# Patient Record
Sex: Female | Born: 1985 | Race: Black or African American | Hispanic: No | Marital: Single | Smoking: Never smoker
Health system: Southern US, Community
[De-identification: ages and names within clinical notes are randomized; demographics above are authoritative.]

## PROBLEM LIST (undated history)

## (undated) DIAGNOSIS — I10 Essential (primary) hypertension: Secondary | ICD-10-CM

## (undated) DIAGNOSIS — Z9889 Other specified postprocedural states: Secondary | ICD-10-CM

## (undated) DIAGNOSIS — E785 Hyperlipidemia, unspecified: Secondary | ICD-10-CM

## (undated) DIAGNOSIS — K219 Gastro-esophageal reflux disease without esophagitis: Secondary | ICD-10-CM

## (undated) DIAGNOSIS — R112 Nausea with vomiting, unspecified: Secondary | ICD-10-CM

## (undated) HISTORY — PX: CHOLECYSTECTOMY: SHX55

## (undated) HISTORY — DX: Essential (primary) hypertension: I10

## (undated) HISTORY — PX: ABDOMINAL SURGERY: SHX537

## (undated) HISTORY — DX: Nausea with vomiting, unspecified: R11.2

## (undated) HISTORY — DX: Other specified postprocedural states: Z98.890

---

## 2014-01-21 ENCOUNTER — Other Ambulatory Visit: Payer: Self-pay | Admitting: Orthopaedic Surgery

## 2014-01-21 DIAGNOSIS — M545 Low back pain, unspecified: Secondary | ICD-10-CM

## 2014-02-27 ENCOUNTER — Ambulatory Visit (INDEPENDENT_AMBULATORY_CARE_PROVIDER_SITE_OTHER): Payer: BC Managed Care – PPO | Admitting: Surgery

## 2014-03-07 ENCOUNTER — Other Ambulatory Visit: Payer: Self-pay | Admitting: Physician Assistant

## 2014-03-07 ENCOUNTER — Other Ambulatory Visit (HOSPITAL_COMMUNITY)
Admission: RE | Admit: 2014-03-07 | Discharge: 2014-03-07 | Disposition: A | Discharge status: Home / Self Care | Payer: BC Managed Care – PPO | Source: Ambulatory Visit | Attending: Family Medicine | Admitting: Family Medicine

## 2014-03-07 DIAGNOSIS — Z124 Encounter for screening for malignant neoplasm of cervix: Secondary | ICD-10-CM | POA: Insufficient documentation

## 2014-03-07 DIAGNOSIS — R87810 Cervical high risk human papillomavirus (HPV) DNA test positive: Secondary | ICD-10-CM | POA: Insufficient documentation

## 2014-03-07 DIAGNOSIS — Z1151 Encounter for screening for human papillomavirus (HPV): Secondary | ICD-10-CM | POA: Insufficient documentation

## 2014-03-11 LAB — CYTOLOGY - PAP

## 2014-03-19 ENCOUNTER — Other Ambulatory Visit: Payer: Self-pay | Admitting: Obstetrics and Gynecology

## 2014-03-21 ENCOUNTER — Other Ambulatory Visit (INDEPENDENT_AMBULATORY_CARE_PROVIDER_SITE_OTHER): Payer: Self-pay

## 2014-03-21 ENCOUNTER — Ambulatory Visit (INDEPENDENT_AMBULATORY_CARE_PROVIDER_SITE_OTHER): Payer: BC Managed Care – PPO | Admitting: Surgery

## 2014-03-21 ENCOUNTER — Encounter (INDEPENDENT_AMBULATORY_CARE_PROVIDER_SITE_OTHER): Payer: Self-pay | Admitting: Surgery

## 2014-03-21 VITALS — BP 124/74 | HR 76 | Temp 97.7°F | Ht 66.0 in | Wt 330.0 lb

## 2014-03-21 DIAGNOSIS — I10 Essential (primary) hypertension: Secondary | ICD-10-CM

## 2014-03-21 DIAGNOSIS — Z9049 Acquired absence of other specified parts of digestive tract: Secondary | ICD-10-CM | POA: Insufficient documentation

## 2014-03-21 NOTE — Addendum Note (Signed)
Addended by: Brennan BaileyBROOKS, Kaitlinn Iversen on: 03/21/2014 11:13 AM   Modules accepted: Orders

## 2014-03-21 NOTE — Patient Instructions (Signed)

## 2014-03-21 NOTE — Progress Notes (Signed)
Chief Complaint:  Morbid obesity BMI 53  History of Present Illness:  Kimberly Boone is an 28 y.o. 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 She has done a lot of research on the Internet about these and feels that this would best suit her needs. I discussed this operation with her specifically including its complications not limited to bleeding and staple line leakage. She is aware of limitations also.  She has no history of gastroesophageal reflux disease. Denies any history of DVT.  She's tried many diets over the years and sustained weight loss has been unsuccessful. Today's weight is 330.    Past Medical History  Diagnosis Date  . Hypertension     Past Surgical History  Procedure Laterality Date  . Cholecystectomy      Current Outpatient Prescriptions  Medication Sig Dispense Refill  . carvedilol (COREG) 6.25 MG tablet Take 6.25 mg by mouth 2 (two) times daily with a meal.       No current facility-administered medications for this visit.   Review of patient's allergies indicates no known allergies. Family History  Problem Relation Age of Onset  . Cancer Father    Social History:   reports that she has never smoked. She does not have any smokeless tobacco history on file. She reports that she drinks alcohol. She reports that she does not use illicit drugs.   REVIEW OF SYSTEMS : Positive for nothing ; otherwise negative  Physical Exam:   Blood pressure 124/74, pulse 76, temperature 97.7 F (36.5 C), height 5\' 6"  (1.676 m), weight 330 lb (149.687 kg). Body mass index is 53.29 kg/(m^2).  Gen:  WDWN African American female NAD  Neurological: Alert and oriented to person, place, and time. Motor and sensory function is grossly intact  Head: Normocephalic and atraumatic.  Eyes: Conjunctivae are normal. Pupils are equal, round, and reactive to light. No scleral icterus.   Neck: Normal range of motion. Neck supple. No tracheal deviation or thyromegaly present.  Cardiovascular:  SR without murmurs or gallops.  No carotid bruits Breast:  Not examined Respiratory: Effort normal.  No respiratory distress. No chest wall tenderness. Breath sounds normal.  No wheezes, rales or rhonchi.  Abdomen:  Obese nontender GU:  Not examined Musculoskeletal: Normal range of motion. Extremities are nontender. No cyanosis, edema or clubbing noted Lymphadenopathy: No cervical, preauricular, postauricular or axillary adenopathy is present Skin: Skin is warm and dry. No rash noted. No diaphoresis. No erythema. No pallor. Pscyh: Normal mood and affect. Behavior is normal. Judgment and thought content normal.   LABORATORY RESULTS: No results found for this or any previous visit (from the past 48 hour(s)).   RADIOLOGY RESULTS: No results found.  Problem List: There are no active problems to display for this patient.   Assessment & Plan: Morbid obesity and a 28 year old without comorbidities except a BMI of 53 and hypertension. We'll begin workup for sleeve gastrectomy.    Matt B. Daphine DeutscherMartin, MD, [facility]  [facility] [phone] beeper [phone]  03/21/2014 10:42 AM

## 2014-03-21 NOTE — Addendum Note (Signed)
Addended by: Maryan PulsMOORE, Colter Magowan on: 03/21/2014 10:54 AM   Modules accepted: Orders

## 2014-04-05 ENCOUNTER — Other Ambulatory Visit (INDEPENDENT_AMBULATORY_CARE_PROVIDER_SITE_OTHER): Payer: Self-pay | Admitting: Surgery

## 2014-04-06 LAB — CBC WITH DIFFERENTIAL/PLATELET
Basophils Absolute: 0 10*3/uL (ref 0.0–0.1)
Basophils Relative: 0 % (ref 0–1)
EOS ABS: 0.1 10*3/uL (ref 0.0–0.7)
Eosinophils Relative: 1 % (ref 0–5)
HCT: 38.6 % (ref 36.0–46.0)
HEMOGLOBIN: 12.6 g/dL (ref 12.0–15.0)
Lymphocytes Relative: 35 % (ref 12–46)
Lymphs Abs: 2 10*3/uL (ref 0.7–4.0)
MCH: 26.8 pg (ref 26.0–34.0)
MCHC: 32.6 g/dL (ref 30.0–36.0)
MCV: 82.1 fL (ref 78.0–100.0)
MONO ABS: 0.4 10*3/uL (ref 0.1–1.0)
MONOS PCT: 7 % (ref 3–12)
Neutro Abs: 3.2 10*3/uL (ref 1.7–7.7)
Neutrophils Relative %: 57 % (ref 43–77)
Platelets: 213 10*3/uL (ref 150–400)
RBC: 4.7 MIL/uL (ref 3.87–5.11)
RDW: 14.4 % (ref 11.5–15.5)
WBC: 5.7 10*3/uL (ref 4.0–10.5)

## 2014-04-06 LAB — COMPREHENSIVE METABOLIC PANEL
ALBUMIN: 3.6 g/dL (ref 3.5–5.2)
ALK PHOS: 51 U/L (ref 39–117)
ALT: 23 U/L (ref 0–35)
AST: 25 U/L (ref 0–37)
BILIRUBIN TOTAL: 0.6 mg/dL (ref 0.2–1.2)
BUN: 11 mg/dL (ref 6–23)
CO2: 27 mEq/L (ref 19–32)
Calcium: 8.8 mg/dL (ref 8.4–10.5)
Chloride: 103 mEq/L (ref 96–112)
Creat: 0.59 mg/dL (ref 0.50–1.10)
GLUCOSE: 101 mg/dL — AB (ref 70–99)
Potassium: 4.4 mEq/L (ref 3.5–5.3)
Sodium: 138 mEq/L (ref 135–145)
Total Protein: 6.6 g/dL (ref 6.0–8.3)

## 2014-04-06 LAB — VITAMIN D 25 HYDROXY (VIT D DEFICIENCY, FRACTURES): VIT D 25 HYDROXY: 34 ng/mL (ref 30–89)

## 2014-04-06 LAB — VITAMIN B12: Vitamin B-12: 554 pg/mL (ref 211–911)

## 2014-04-06 LAB — IRON AND TIBC
%SAT: 31 % (ref 20–55)
Iron: 97 ug/dL (ref 42–145)
TIBC: 310 ug/dL (ref 250–470)
UIBC: 213 ug/dL (ref 125–400)

## 2014-04-06 LAB — PREGNANCY, URINE: PREG TEST UR: NEGATIVE

## 2014-04-06 LAB — TSH: TSH: 2.906 u[IU]/mL (ref 0.350–4.500)

## 2014-04-06 LAB — T4: T4 TOTAL: 8.6 ug/dL (ref 5.0–12.5)

## 2014-04-09 ENCOUNTER — Ambulatory Visit (HOSPITAL_COMMUNITY)
Admission: RE | Admit: 2014-04-09 | Discharge: 2014-04-09 | Disposition: A | Discharge status: Home / Self Care | Payer: BC Managed Care – PPO | Source: Ambulatory Visit | Attending: Surgery | Admitting: Surgery

## 2014-04-09 ENCOUNTER — Encounter (HOSPITAL_COMMUNITY)
Admission: RE | Disposition: A | Discharge status: Home / Self Care | Payer: Self-pay | Source: Ambulatory Visit | Attending: Surgery

## 2014-04-09 DIAGNOSIS — Z01818 Encounter for other preprocedural examination: Secondary | ICD-10-CM | POA: Insufficient documentation

## 2014-04-09 HISTORY — PX: BREATH TEK H PYLORI: SHX5422

## 2014-04-09 SURGERY — BREATH TEST, FOR HELICOBACTER PYLORI

## 2014-04-09 NOTE — Progress Notes (Signed)
04/09/14 0840  BREATH TEK ASSESSMENT  Referring MD Luretha MurphyMatthew Martin  Time of Last PO Intake 1900  Baseline Breath At: 0712  Pranactin Given At: 0716  Post-Dose Breath At: 0731  Sample 1 4.5  Sample 2 3.0  Test Negative

## 2014-04-10 ENCOUNTER — Encounter (HOSPITAL_COMMUNITY): Payer: Self-pay | Admitting: Surgery

## 2014-04-24 ENCOUNTER — Ambulatory Visit (HOSPITAL_COMMUNITY)
Admission: RE | Admit: 2014-04-24 | Discharge: 2014-04-24 | Disposition: A | Discharge status: Home / Self Care | Payer: BC Managed Care – PPO | Source: Ambulatory Visit | Attending: Surgery | Admitting: Surgery

## 2014-04-24 ENCOUNTER — Other Ambulatory Visit: Payer: Self-pay

## 2014-04-24 DIAGNOSIS — Z9049 Acquired absence of other specified parts of digestive tract: Secondary | ICD-10-CM

## 2014-04-24 DIAGNOSIS — Z6841 Body Mass Index (BMI) 40.0 and over, adult: Secondary | ICD-10-CM | POA: Insufficient documentation

## 2014-04-24 DIAGNOSIS — I10 Essential (primary) hypertension: Secondary | ICD-10-CM

## 2014-04-24 DIAGNOSIS — Z9889 Other specified postprocedural states: Secondary | ICD-10-CM | POA: Insufficient documentation

## 2014-04-27 ENCOUNTER — Encounter: Payer: BC Managed Care – PPO | Attending: Surgery | Admitting: Dietician

## 2014-04-27 DIAGNOSIS — Z6841 Body Mass Index (BMI) 40.0 and over, adult: Secondary | ICD-10-CM | POA: Diagnosis not present

## 2014-04-27 DIAGNOSIS — Z713 Dietary counseling and surveillance: Secondary | ICD-10-CM | POA: Diagnosis present

## 2014-04-27 NOTE — Progress Notes (Signed)
  Pre-Op Assessment Visit:  Pre-Operative Sleeve Gastrectomy Surgery  Medical Nutrition Therapy:  Appt start time: 1115   End time:  1200.  Patient was seen on 04/27/2014 for Pre-Operative Sleeve Gastrectomy Nutrition Assessment. [facility] on 04/27/2014.   Preferred Learning Style:   No preference indicated   Learning Readiness:   Ready  Handouts given during visit include:  Pre-Op Goals Bariatric Surgery Protein Shakes  Teaching Method Utilized:  Visual Auditory Hands on  Barriers to learning/adherence to lifestyle change: none  Demonstrated degree of understanding via:  Teach Back   Patient to call the [facility] to enroll in Pre-Op and Post-Op Nutrition Education when surgery date is scheduled.

## 2014-04-27 NOTE — Patient Instructions (Signed)
Follow Pre-Op Goals Try Protein Shakes Call [facility] at [phone] when surgery is scheduled to enroll in Pre-Op Class 

## 2014-06-17 ENCOUNTER — Encounter: Payer: BC Managed Care – PPO | Attending: Surgery

## 2014-06-17 DIAGNOSIS — Z713 Dietary counseling and surveillance: Secondary | ICD-10-CM | POA: Diagnosis present

## 2014-06-17 DIAGNOSIS — Z6841 Body Mass Index (BMI) 40.0 and over, adult: Secondary | ICD-10-CM | POA: Insufficient documentation

## 2014-06-18 NOTE — Progress Notes (Signed)
  Pre-Operative Nutrition Class:  Appt start time: 1252   End time:  1830.  Patient was seen on 06/17/2014 for Pre-Operative Bariatric Surgery Education at the [facility].   Surgery date: 07/09/2014 Surgery type: Gastric sleeve [facility] weight at [facility]: 332.5 lbs on 04/27/2014 Weight today: 335 lbs  TANITA  BODY COMP RESULTS  06/17/14   BMI (kg/m^2) 54.1   Fat Mass (lbs) 188.5   Fat Free Mass (lbs) 146.5   Total Body Water (lbs) 107.5   Samples given per MNT protocol. Patient educated on appropriate usage: Premier protein shake (chocolate - qty 1) Lot #: [id number] Exp: 05/2015  Unjury protein powder (strawberry - qty 1) Lot #: [id number] Exp: 04/2015  Celebrate Vitamins Multivitamin (orange - qty 1) Lot #: [id number] Exp: 12/2014  PB2 (qty 1) Lot #: [id number] Exp: 04/2015  Bariactiv Multivitamin (qty 1) Lot #: [id number] Exp: 01/2015  The following the learning objectives were met by the patient during this course:  Identify Pre-Op Dietary Goals and will begin 2 weeks pre-operatively  Identify appropriate sources of fluids and proteins   State protein recommendations and appropriate sources pre and post-operatively  Identify Post-Operative Dietary Goals and will follow for 2 weeks post-operatively  Identify appropriate multivitamin and calcium sources  Describe the need for physical activity post-operatively and will follow MD recommendations  State when to call healthcare provider regarding medication questions or post-operative complications  Handouts given during class include:  Pre-Op Bariatric Surgery Diet Handout  Protein Shake Handout  Post-Op Bariatric Surgery Nutrition Handout  BELT Program Information Flyer  Support Group Information Flyer  [facility] Bariatric Supplements Price List  Follow-Up Plan: Patient will follow-up at [facility] 2 weeks post operatively for diet advancement per MD.

## 2014-06-24 ENCOUNTER — Ambulatory Visit: Payer: BC Managed Care – PPO

## 2014-06-27 NOTE — Progress Notes (Signed)
Please put orders in Epic surgery 07-09-14 pre op 07-02-14 Thanks 

## 2014-06-28 ENCOUNTER — Encounter (HOSPITAL_COMMUNITY): Payer: Self-pay | Admitting: Pharmacy Technician

## 2014-07-01 NOTE — Patient Instructions (Addendum)
Kimberly Boone  07/01/2014                           YOUR PROCEDURE IS SCHEDULED ON: 07/09/14                ENTER FROM [address] - GO TO [address]ARKING DECK               LOOK FOR VALET [employer]ARKING  / [employer]                              FOLLOW  SIGNS TO SHORT STAY CENTER                 ARRIVE AT SHORT STAY AT:  8:00 am               CALL THIS NUMBER IF ANY PROBLEMS THE DAY OF SURGERY :               [phone]                                REMEMBER:   Do not eat food or drink liquids AFTER MIDNIGHT                  Take these medicines the morning of surgery with               A SIPS OF WATER :     carvedilol    Do not wear jewelry, make-up   Do not wear lotions, powders, or perfumes.   Do not shave legs or underarms 12 hrs. before surgery (men may shave face)  Do not bring valuables to the hospital.  Contacts, dentures or bridgework may not be worn into surgery.  Leave suitcase in the car. After surgery it may be brought to your room.  For patients admitted to the hospital more than one night, checkout time is            11:00 AM                                                       ________________________________________________________________________                                                                                                  [place] - PREPARING FOR SURGERY  Before surgery, you can play an important role.  Because skin is not sterile, your skin needs to be as free of germs as possible.  You can reduce the number of germs on your skin by washing with CHG (chlorahexidine gluconate) soap before surgery.  CHG is an antiseptic cleaner which kills germs and bonds with the skin to continue killing germs even after washing. Please DO NOT use if you  have an allergy to CHG or antibacterial soaps.  If your skin becomes reddened/irritated stop using the CHG and inform your nurse when you arrive at Short Stay. Do not shave (including legs  and underarms) for at least 48 hours prior to the first CHG shower.  You may shave your face. Please follow these instructions carefully:   1.  Shower with CHG Soap the night before surgery and the  morning of Surgery.   2.  If you choose to wash your hair, wash your hair first as usual with your  normal  Shampoo.   3.  After you shampoo, rinse your hair and body thoroughly to remove the  shampoo.                                         4.  Use CHG as you would any other liquid soap.  You can apply chg directly  to the skin and wash . Gently wash with scrungie or clean wascloth    5.  Apply the CHG Soap to your body ONLY FROM THE NECK DOWN.   Do not use on open                           Wound or open sores. Avoid contact with eyes, ears mouth and genitals (private parts).                        Genitals (private parts) with your normal soap.              6.  Wash thoroughly, paying special attention to the area where your surgery  will be performed.   7.  Thoroughly rinse your body with warm water from the neck down.   8.  DO NOT shower/wash with your normal soap after using and rinsing off  the CHG Soap .                9.  Pat yourself dry with a clean towel.             10.  Wear clean pajamas.             11.  Place clean sheets on your bed the night of your first shower and do not  sleep with pets.  Day of Surgery : Do not apply any lotions/deodorants the morning of surgery.  Please wear clean clothes to the hospital/surgery center.  FAILURE TO FOLLOW THESE INSTRUCTIONS MAY RESULT IN THE CANCELLATION OF YOUR SURGERY    PATIENT SIGNATURE_________________________________  ______________________________________________________________________

## 2014-07-01 NOTE — Progress Notes (Signed)
NEED ORDERS IN EPIC PLEASE - pt coming for preop tomorrow - 07/02/14 - thank you

## 2014-07-02 ENCOUNTER — Encounter (HOSPITAL_COMMUNITY)
Admission: RE | Admit: 2014-07-02 | Discharge: 2014-07-02 | Disposition: A | Discharge status: Home / Self Care | Payer: BC Managed Care – PPO | Source: Ambulatory Visit | Attending: Surgery | Admitting: Surgery

## 2014-07-02 ENCOUNTER — Encounter (HOSPITAL_COMMUNITY): Payer: Self-pay

## 2014-07-02 DIAGNOSIS — Z6841 Body Mass Index (BMI) 40.0 and over, adult: Secondary | ICD-10-CM | POA: Diagnosis not present

## 2014-07-02 DIAGNOSIS — Z0000 Encounter for general adult medical examination without abnormal findings: Secondary | ICD-10-CM | POA: Diagnosis not present

## 2014-07-02 DIAGNOSIS — I10 Essential (primary) hypertension: Secondary | ICD-10-CM | POA: Diagnosis not present

## 2014-07-02 LAB — COMPREHENSIVE METABOLIC PANEL
ALBUMIN: 3.7 g/dL (ref 3.5–5.2)
ALK PHOS: 63 U/L (ref 39–117)
ALT: 27 U/L (ref 0–35)
ANION GAP: 14 (ref 5–15)
AST: 21 U/L (ref 0–37)
BILIRUBIN TOTAL: 0.5 mg/dL (ref 0.3–1.2)
BUN: 16 mg/dL (ref 6–23)
CHLORIDE: 102 meq/L (ref 96–112)
CO2: 23 mEq/L (ref 19–32)
Calcium: 9.6 mg/dL (ref 8.4–10.5)
Creatinine, Ser: 0.57 mg/dL (ref 0.50–1.10)
GFR calc Af Amer: >90 mL/min (ref >90)
GFR calc non Af Amer: >90 mL/min (ref >90)
GLUCOSE: 86 mg/dL (ref 70–99)
POTASSIUM: 4.9 meq/L (ref 3.7–5.3)
Sodium: 139 mEq/L (ref 137–147)
Total Protein: 8 g/dL (ref 6.0–8.3)

## 2014-07-02 LAB — CBC
HCT: 37.3 % (ref 36.0–46.0)
HEMOGLOBIN: 12.7 g/dL (ref 12.0–15.0)
MCH: 27.5 pg (ref 26.0–34.0)
MCHC: 34 g/dL (ref 30.0–36.0)
MCV: 80.7 fL (ref 78.0–100.0)
Platelets: 210 10*3/uL (ref 150–400)
RBC: 4.62 MIL/uL (ref 3.87–5.11)
RDW: 13.4 % (ref 11.5–15.5)
WBC: 7.1 10*3/uL (ref 4.0–10.5)

## 2014-07-02 LAB — HCG, SERUM, QUALITATIVE: Preg, Serum: NEGATIVE

## 2014-07-08 ENCOUNTER — Ambulatory Visit (INDEPENDENT_AMBULATORY_CARE_PROVIDER_SITE_OTHER): Payer: Self-pay | Admitting: Surgery

## 2014-07-08 NOTE — H&P (Signed)
Chief Complaint: Morbid obesity BMI 53  History of Present Illness: Kimberly Boone is an 28 y.o. 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 She has done a lot of research on the Internet about these and feels that this would best suit her needs. I discussed this operation with her specifically including its complications not limited to bleeding and staple line leakage. She is aware of limitations also. She has no history of gastroesophageal reflux disease. Denies any history of DVT.  She's tried many diets over the years and sustained weight loss has been unsuccessful. Today's weight is 330.  Past Medical History   Diagnosis  Date   .  Hypertension     Past Surgical History   Procedure  Laterality  Date   .  Cholecystectomy      Current Outpatient Prescriptions   Medication  Sig  Dispense  Refill   .  carvedilol (COREG) 6.25 MG tablet  Take 6.25 mg by mouth 2 (two) times daily with a meal.      No current facility-administered medications for this visit.   Review of patient's allergies indicates no known allergies.  Family History   Problem  Relation  Age of Onset   .  Cancer  Father    Social History: reports that she has never smoked. She does not have any smokeless tobacco history on file. She reports that she drinks alcohol. She reports that she does not use illicit drugs.  REVIEW OF SYSTEMS :  Positive for nothing ; otherwise negative  Physical Exam:  Blood pressure 124/74, pulse 76, temperature 97.7 F (36.5 C), height 5\' 6"  (1.676 m), weight 330 lb (149.687 kg).  Body mass index is 53.29 kg/(m^2).  Gen: WDWN African American female NAD  Neurological: Alert and oriented to person, place, and time. Motor and sensory function is grossly intact  Head: Normocephalic and atraumatic.  Eyes: Conjunctivae are normal. Pupils are equal, round, and reactive to light. No scleral  icterus.  Neck: Normal range of motion. Neck supple. No tracheal deviation or thyromegaly present.  Cardiovascular: SR without murmurs or gallops. No carotid bruits  Breast: Not examined  Respiratory: Effort normal. No respiratory distress. No chest wall tenderness. Breath sounds normal. No wheezes, rales or rhonchi.  Abdomen: Obese nontender  GU: Not examined  Musculoskeletal: Normal range of motion. Extremities are nontender. No cyanosis, edema or clubbing noted Lymphadenopathy: No cervical, preauricular, postauricular or axillary adenopathy is present Skin: Skin is warm and dry. No rash noted. No diaphoresis. No erythema. No pallor. Pscyh: Normal mood and affect. Behavior is normal. Judgment and thought content normal.  LABORATORY RESULTS:  No results found for this or any previous visit (from the past 48 hour(s)).  RADIOLOGY RESULTS:  No results found.  Problem List:  There are no active problems to display for this patient.  Assessment & Plan:  Morbid obesity and a 28 year old without comorbidities except a BMI of 53 and hypertension. We'll begin workup for sleeve gastrectomy.  Matt B. Daphine DeutscherMartin, MD, [facility]ACS  [facility]  [phone] beeper  [phone]

## 2014-07-09 ENCOUNTER — Inpatient Hospital Stay (HOSPITAL_COMMUNITY): Payer: BC Managed Care – PPO | Admitting: Anesthesiology

## 2014-07-09 ENCOUNTER — Encounter (HOSPITAL_COMMUNITY)
Admission: RE | Disposition: A | Discharge status: Home / Self Care | Payer: Self-pay | Source: Ambulatory Visit | Attending: Surgery

## 2014-07-09 ENCOUNTER — Encounter (HOSPITAL_COMMUNITY): Payer: Self-pay | Admitting: *Deleted

## 2014-07-09 ENCOUNTER — Inpatient Hospital Stay (HOSPITAL_COMMUNITY)
Admission: RE | Admit: 2014-07-09 | Discharge: 2014-07-11 | DRG: 621 | Disposition: A | Discharge status: Home / Self Care | Payer: BC Managed Care – PPO | Source: Ambulatory Visit | Attending: Surgery | Admitting: Surgery

## 2014-07-09 ENCOUNTER — Encounter (HOSPITAL_COMMUNITY): Payer: BC Managed Care – PPO | Admitting: Anesthesiology

## 2014-07-09 DIAGNOSIS — Z9049 Acquired absence of other specified parts of digestive tract: Secondary | ICD-10-CM | POA: Diagnosis present

## 2014-07-09 DIAGNOSIS — Z01812 Encounter for preprocedural laboratory examination: Secondary | ICD-10-CM | POA: Diagnosis not present

## 2014-07-09 DIAGNOSIS — Z9884 Bariatric surgery status: Secondary | ICD-10-CM

## 2014-07-09 DIAGNOSIS — Z79899 Other long term (current) drug therapy: Secondary | ICD-10-CM

## 2014-07-09 DIAGNOSIS — Z809 Family history of malignant neoplasm, unspecified: Secondary | ICD-10-CM | POA: Diagnosis not present

## 2014-07-09 DIAGNOSIS — Z6841 Body Mass Index (BMI) 40.0 and over, adult: Secondary | ICD-10-CM | POA: Diagnosis not present

## 2014-07-09 DIAGNOSIS — I10 Essential (primary) hypertension: Secondary | ICD-10-CM | POA: Diagnosis present

## 2014-07-09 HISTORY — PX: LAPAROSCOPIC GASTRIC SLEEVE RESECTION: SHX5895

## 2014-07-09 LAB — HEMOGLOBIN AND HEMATOCRIT, BLOOD
HCT: 36 % (ref 36.0–46.0)
Hemoglobin: 11.8 g/dL — ABNORMAL LOW (ref 12.0–15.0)

## 2014-07-09 LAB — CBC
HCT: 37.4 % (ref 36.0–46.0)
Hemoglobin: 12.7 g/dL (ref 12.0–15.0)
MCH: 27.6 pg (ref 26.0–34.0)
MCHC: 34 g/dL (ref 30.0–36.0)
MCV: 81.3 fL (ref 78.0–100.0)
PLATELETS: 217 10*3/uL (ref 150–400)
RBC: 4.6 MIL/uL (ref 3.87–5.11)
RDW: 13.1 % (ref 11.5–15.5)
WBC: 13.6 10*3/uL — ABNORMAL HIGH (ref 4.0–10.5)

## 2014-07-09 LAB — CREATININE, SERUM
CREATININE: 0.69 mg/dL (ref 0.50–1.10)
GFR calc Af Amer: >90 mL/min (ref >90)
GFR calc non Af Amer: >90 mL/min (ref >90)

## 2014-07-09 SURGERY — GASTRECTOMY, SLEEVE, LAPAROSCOPIC
Anesthesia: General | Site: Abdomen

## 2014-07-09 MED ORDER — HEPARIN SODIUM (PORCINE) 5000 UNIT/ML IJ SOLN
5000.0000 [IU] | INTRAMUSCULAR | Status: AC
  Administered 2014-07-09: 5000 [IU] via SUBCUTANEOUS
  Filled 2014-07-09: qty 1

## 2014-07-09 MED ORDER — LACTATED RINGERS IR SOLN
Status: DC | PRN
Start: 2014-07-09 — End: 2014-07-09
  Administered 2014-07-09: 1000 mL

## 2014-07-09 MED ORDER — HYDROMORPHONE HCL 1 MG/ML IJ SOLN
INTRAMUSCULAR | Status: AC
  Filled 2014-07-09: qty 1

## 2014-07-09 MED ORDER — PROPOFOL 10 MG/ML IV BOLUS
INTRAVENOUS | Status: DC | PRN
  Administered 2014-07-09: 50 mg via INTRAVENOUS
  Administered 2014-07-09: 170 mg via INTRAVENOUS

## 2014-07-09 MED ORDER — SUFENTANIL CITRATE 50 MCG/ML IV SOLN
INTRAVENOUS | Status: AC
  Filled 2014-07-09: qty 1

## 2014-07-09 MED ORDER — DEXTROSE 5 % IV SOLN
INTRAVENOUS | Status: AC
  Filled 2014-07-09: qty 2

## 2014-07-09 MED ORDER — CHLORHEXIDINE GLUCONATE CLOTH 2 % EX PADS: 6.0000 | MEDICATED_PAD | Freq: Once | CUTANEOUS | Status: DC

## 2014-07-09 MED ORDER — ACETAMINOPHEN 160 MG/5ML PO SOLN: 325.0000 mg | ORAL | Status: DC | PRN

## 2014-07-09 MED ORDER — UNJURY CHOCOLATE CLASSIC POWDER: 2.0000 [oz_av] | Freq: Four times a day (QID) | ORAL | Status: DC

## 2014-07-09 MED ORDER — PHENYLEPHRINE 40 MCG/ML (10ML) SYRINGE FOR IV PUSH (FOR BLOOD PRESSURE SUPPORT)
PREFILLED_SYRINGE | INTRAVENOUS | Status: AC
  Filled 2014-07-09: qty 10

## 2014-07-09 MED ORDER — ACETAMINOPHEN 160 MG/5ML PO SOLN: 650.0000 mg | ORAL | Status: DC | PRN

## 2014-07-09 MED ORDER — FENTANYL CITRATE 0.05 MG/ML IJ SOLN
INTRAMUSCULAR | Status: DC | PRN
  Administered 2014-07-09 (×2): 50 ug via INTRAVENOUS
  Administered 2014-07-09: 100 ug via INTRAVENOUS
  Administered 2014-07-09 (×2): 50 ug via INTRAVENOUS

## 2014-07-09 MED ORDER — 0.9 % SODIUM CHLORIDE (POUR BTL) OPTIME
TOPICAL | Status: DC | PRN
  Administered 2014-07-09: 1000 mL

## 2014-07-09 MED ORDER — SUCCINYLCHOLINE CHLORIDE 20 MG/ML IJ SOLN
INTRAMUSCULAR | Status: DC | PRN
  Administered 2014-07-09: 140 mg via INTRAVENOUS

## 2014-07-09 MED ORDER — METOCLOPRAMIDE HCL 5 MG/ML IJ SOLN
INTRAMUSCULAR | Status: AC
  Filled 2014-07-09: qty 2

## 2014-07-09 MED ORDER — LIDOCAINE HCL (CARDIAC) 20 MG/ML IV SOLN
INTRAVENOUS | Status: DC | PRN
  Administered 2014-07-09: 25 mg via INTRAVENOUS
  Administered 2014-07-09: 75 mg via INTRAVENOUS

## 2014-07-09 MED ORDER — FENTANYL CITRATE 0.05 MG/ML IJ SOLN
25.0000 ug | INTRAMUSCULAR | Status: DC | PRN
  Administered 2014-07-09 (×3): 50 ug via INTRAVENOUS

## 2014-07-09 MED ORDER — UNJURY CHICKEN SOUP POWDER
2.0000 [oz_av] | Freq: Four times a day (QID) | ORAL | Status: DC
  Administered 2014-07-11: 8 [oz_av] via ORAL
  Administered 2014-07-11: 2 [oz_av] via ORAL

## 2014-07-09 MED ORDER — FENTANYL CITRATE 0.05 MG/ML IJ SOLN
INTRAMUSCULAR | Status: AC
  Filled 2014-07-09: qty 2

## 2014-07-09 MED ORDER — BUPIVACAINE LIPOSOME 1.3 % IJ SUSP
20.0000 mL | Freq: Once | INTRAMUSCULAR | Status: AC
  Administered 2014-07-09: 20 mL
  Filled 2014-07-09: qty 20

## 2014-07-09 MED ORDER — MIDAZOLAM HCL 2 MG/2ML IJ SOLN
INTRAMUSCULAR | Status: AC
  Filled 2014-07-09: qty 2

## 2014-07-09 MED ORDER — ACETAMINOPHEN 10 MG/ML IV SOLN
1000.0000 mg | Freq: Once | INTRAVENOUS | Status: DC
  Filled 2014-07-09: qty 100

## 2014-07-09 MED ORDER — PROMETHAZINE HCL 25 MG/ML IJ SOLN
12.5000 mg | INTRAMUSCULAR | Status: DC | PRN
  Administered 2014-07-10 (×2): 12.5 mg via INTRAVENOUS
  Filled 2014-07-09 (×2): qty 1

## 2014-07-09 MED ORDER — DEXAMETHASONE SODIUM PHOSPHATE 10 MG/ML IJ SOLN
INTRAMUSCULAR | Status: DC | PRN
  Administered 2014-07-09: 10 mg via INTRAVENOUS

## 2014-07-09 MED ORDER — PROPOFOL 10 MG/ML IV BOLUS
INTRAVENOUS | Status: AC
  Filled 2014-07-09: qty 20

## 2014-07-09 MED ORDER — ONDANSETRON HCL 4 MG/2ML IJ SOLN
INTRAMUSCULAR | Status: AC
  Filled 2014-07-09: qty 2

## 2014-07-09 MED ORDER — METOCLOPRAMIDE HCL 5 MG/ML IJ SOLN
INTRAMUSCULAR | Status: DC | PRN
  Administered 2014-07-09 (×2): 10 mg via INTRAVENOUS

## 2014-07-09 MED ORDER — GLYCOPYRROLATE 0.2 MG/ML IJ SOLN
INTRAMUSCULAR | Status: AC
  Filled 2014-07-09: qty 1

## 2014-07-09 MED ORDER — PHENYLEPHRINE HCL 10 MG/ML IJ SOLN
INTRAMUSCULAR | Status: DC | PRN
  Administered 2014-07-09: 80 ug via INTRAVENOUS
  Administered 2014-07-09: 40 ug via INTRAVENOUS
  Administered 2014-07-09: 80 ug via INTRAVENOUS
  Administered 2014-07-09: 40 ug via INTRAVENOUS
  Administered 2014-07-09: 80 ug via INTRAVENOUS

## 2014-07-09 MED ORDER — LACTATED RINGERS IV SOLN
INTRAVENOUS | Status: DC
  Administered 2014-07-09: 12:00:00 via INTRAVENOUS
  Administered 2014-07-09: 1000 mL via INTRAVENOUS

## 2014-07-09 MED ORDER — ONDANSETRON HCL 4 MG/2ML IJ SOLN: 4.0000 mg | Freq: Once | INTRAMUSCULAR | Status: DC | PRN

## 2014-07-09 MED ORDER — GLYCOPYRROLATE 0.2 MG/ML IJ SOLN
INTRAMUSCULAR | Status: AC
  Filled 2014-07-09: qty 3

## 2014-07-09 MED ORDER — CISATRACURIUM BESYLATE (PF) 10 MG/5ML IV SOLN
INTRAVENOUS | Status: DC | PRN
  Administered 2014-07-09: 2 mg via INTRAVENOUS
  Administered 2014-07-09: 12 mg via INTRAVENOUS
  Administered 2014-07-09 (×2): 2 mg via INTRAVENOUS

## 2014-07-09 MED ORDER — GLYCOPYRROLATE 0.2 MG/ML IJ SOLN
INTRAMUSCULAR | Status: DC | PRN
  Administered 2014-07-09: 0.6 mg via INTRAVENOUS
  Administered 2014-07-09: 0.1 mg via INTRAVENOUS

## 2014-07-09 MED ORDER — MORPHINE SULFATE 2 MG/ML IJ SOLN
2.0000 mg | INTRAMUSCULAR | Status: DC | PRN
  Administered 2014-07-09: 4 mg via INTRAVENOUS
  Administered 2014-07-09 – 2014-07-10 (×4): 2 mg via INTRAVENOUS
  Filled 2014-07-09: qty 1
  Filled 2014-07-09: qty 2
  Filled 2014-07-09 (×5): qty 1

## 2014-07-09 MED ORDER — CEFOXITIN SODIUM 2 G IV SOLR
2.0000 g | INTRAVENOUS | Status: AC
  Administered 2014-07-09: 2 g via INTRAVENOUS

## 2014-07-09 MED ORDER — DEXAMETHASONE SODIUM PHOSPHATE 10 MG/ML IJ SOLN
INTRAMUSCULAR | Status: AC
  Filled 2014-07-09: qty 1

## 2014-07-09 MED ORDER — ONDANSETRON HCL 4 MG/2ML IJ SOLN
4.0000 mg | INTRAMUSCULAR | Status: DC | PRN
  Administered 2014-07-09 – 2014-07-10 (×3): 4 mg via INTRAVENOUS
  Filled 2014-07-09 (×4): qty 2

## 2014-07-09 MED ORDER — MIDAZOLAM HCL 5 MG/5ML IJ SOLN
INTRAMUSCULAR | Status: DC | PRN
  Administered 2014-07-09: 2 mg via INTRAVENOUS

## 2014-07-09 MED ORDER — KCL IN DEXTROSE-NACL 20-5-0.45 MEQ/L-%-% IV SOLN
INTRAVENOUS | Status: DC
Start: 2014-07-09 — End: 2014-07-11
  Administered 2014-07-09 – 2014-07-10 (×2): via INTRAVENOUS
  Administered 2014-07-10: 100 mL/h via INTRAVENOUS
  Administered 2014-07-10: 23:00:00 via INTRAVENOUS
  Filled 2014-07-09 (×6): qty 1000

## 2014-07-09 MED ORDER — NEOSTIGMINE METHYLSULFATE 10 MG/10ML IV SOLN
INTRAVENOUS | Status: AC
  Filled 2014-07-09: qty 1

## 2014-07-09 MED ORDER — FENTANYL CITRATE 0.05 MG/ML IJ SOLN
INTRAMUSCULAR | Status: AC
  Filled 2014-07-09: qty 5

## 2014-07-09 MED ORDER — NEOSTIGMINE METHYLSULFATE 10 MG/10ML IV SOLN
INTRAVENOUS | Status: DC | PRN
  Administered 2014-07-09: 5 mg via INTRAVENOUS

## 2014-07-09 MED ORDER — LIDOCAINE HCL (CARDIAC) 20 MG/ML IV SOLN
INTRAVENOUS | Status: AC
  Filled 2014-07-09: qty 5

## 2014-07-09 MED ORDER — UNJURY VANILLA POWDER: 2.0000 [oz_av] | Freq: Four times a day (QID) | ORAL | Status: DC

## 2014-07-09 MED ORDER — HEPARIN SODIUM (PORCINE) 5000 UNIT/ML IJ SOLN
5000.0000 [IU] | Freq: Three times a day (TID) | INTRAMUSCULAR | Status: DC
  Administered 2014-07-09 – 2014-07-11 (×5): 5000 [IU] via SUBCUTANEOUS
  Filled 2014-07-09 (×8): qty 1

## 2014-07-09 MED ORDER — OXYCODONE HCL 5 MG/5ML PO SOLN
5.0000 mg | ORAL | Status: DC | PRN
  Administered 2014-07-10: 5 mg via ORAL
  Administered 2014-07-10: 10 mg via ORAL
  Administered 2014-07-10: 5 mg via ORAL
  Filled 2014-07-09: qty 10
  Filled 2014-07-09 (×2): qty 5
  Filled 2014-07-09: qty 10

## 2014-07-09 MED ORDER — CISATRACURIUM BESYLATE 20 MG/10ML IV SOLN
INTRAVENOUS | Status: AC
  Filled 2014-07-09: qty 10

## 2014-07-09 MED ORDER — ONDANSETRON HCL 4 MG/2ML IJ SOLN
INTRAMUSCULAR | Status: DC | PRN
  Administered 2014-07-09 (×2): 4 mg via INTRAVENOUS

## 2014-07-09 MED ORDER — ACETAMINOPHEN 10 MG/ML IV SOLN
INTRAVENOUS | Status: DC | PRN
  Administered 2014-07-09: 1000 mg via INTRAVENOUS

## 2014-07-09 MED ORDER — HYDROMORPHONE HCL 1 MG/ML IJ SOLN
0.2500 mg | INTRAMUSCULAR | Status: DC | PRN
  Administered 2014-07-09 (×4): 0.5 mg via INTRAVENOUS

## 2014-07-09 SURGICAL SUPPLY — 61 items
APPLICATOR COTTON TIP 6IN STRL (MISCELLANEOUS) ×3 IMPLANT
APPLIER CLIP ROT 10 11.4 M/L (STAPLE)
APPLIER CLIP ROT 13.4 12 LRG (CLIP)
BLADE HEX COATED 2.75 (ELECTRODE) IMPLANT
BLADE SURG 15 STRL LF DISP TIS (BLADE) ×1 IMPLANT
BLADE SURG 15 STRL SS (BLADE) ×2
CABLE HIGH FREQUENCY MONO STRZ (ELECTRODE) IMPLANT
CLIP APPLIE ROT 10 11.4 M/L (STAPLE) IMPLANT
CLIP APPLIE ROT 13.4 12 LRG (CLIP) IMPLANT
DERMABOND ADVANCED (GAUZE/BANDAGES/DRESSINGS) ×2
DERMABOND ADVANCED .7 DNX12 (GAUZE/BANDAGES/DRESSINGS) ×1 IMPLANT
DEVICE SUT QUICK LOAD TK 5 (STAPLE) IMPLANT
DEVICE SUT TI-KNOT TK 5X26 (MISCELLANEOUS) IMPLANT
DEVICE SUTURE ENDOST 10MM (ENDOMECHANICALS) IMPLANT
DEVICE TI KNOT TK5 (MISCELLANEOUS)
DEVICE TROCAR PUNCTURE CLOSURE (ENDOMECHANICALS) ×3 IMPLANT
DISSECTOR BLUNT TIP ENDO 5MM (MISCELLANEOUS) ×3 IMPLANT
DRAPE CAMERA CLOSED 9X96 (DRAPES) ×3 IMPLANT
DRAPE UNIVERSAL PACK (DRAPES) ×3 IMPLANT
ELECT REM PT RETURN 9FT ADLT (ELECTROSURGICAL) ×3
ELECTRODE REM PT RTRN 9FT ADLT (ELECTROSURGICAL) ×1 IMPLANT
GAUZE SPONGE 4X4 12PLY STRL (GAUZE/BANDAGES/DRESSINGS) IMPLANT
GLOVE BIOGEL M 8.0 STRL (GLOVE) ×3 IMPLANT
GOWN STRL REUS W/TWL XL LVL3 (GOWN DISPOSABLE) ×9 IMPLANT
HANDLE STAPLE EGIA 4 XL (STAPLE) ×3 IMPLANT
HOVERMATT SINGLE USE (MISCELLANEOUS) ×3 IMPLANT
KIT BASIN OR (CUSTOM PROCEDURE TRAY) ×3 IMPLANT
NEEDLE SPNL 22GX3.5 QUINCKE BK (NEEDLE) ×3 IMPLANT
PEN SKIN MARKING BROAD (MISCELLANEOUS) ×3 IMPLANT
QUICK LOAD TK 5 (STAPLE)
RELOAD ENDO STITCH (ENDOMECHANICALS) IMPLANT
RELOAD TRI 45 ART MED THCK BLK (STAPLE) ×3 IMPLANT
RELOAD TRI 45 ART MED THCK PUR (STAPLE) IMPLANT
RELOAD TRI 60 ART MED THCK BLK (STAPLE) ×6 IMPLANT
RELOAD TRI 60 ART MED THCK PUR (STAPLE) ×6 IMPLANT
SCISSORS LAP 5X45 EPIX DISP (ENDOMECHANICALS) IMPLANT
SCRUB PCMX 4 OZ (MISCELLANEOUS) ×6 IMPLANT
SEALANT SURGICAL APPL DUAL CAN (MISCELLANEOUS) IMPLANT
SET IRRIG TUBING LAPAROSCOPIC (IRRIGATION / IRRIGATOR) ×3 IMPLANT
SHEARS CURVED HARMONIC AC 45CM (MISCELLANEOUS) ×3 IMPLANT
SLEEVE ADV FIXATION 5X100MM (TROCAR) ×6 IMPLANT
SLEEVE GASTRECTOMY 36FR VISIGI (MISCELLANEOUS) ×3 IMPLANT
SOLUTION ANTI FOG 6CC (MISCELLANEOUS) ×3 IMPLANT
SPONGE LAP 18X18 X RAY DECT (DISPOSABLE) ×3 IMPLANT
STAPLER VISISTAT 35W (STAPLE) ×3 IMPLANT
SUT VIC AB 4-0 SH 18 (SUTURE) ×3 IMPLANT
SYR 20CC LL (SYRINGE) ×3 IMPLANT
SYR 50ML LL SCALE MARK (SYRINGE) ×3 IMPLANT
TOWEL OR 17X26 10 PK STRL BLUE (TOWEL DISPOSABLE) ×6 IMPLANT
TOWEL OR NON WOVEN STRL DISP B (DISPOSABLE) ×3 IMPLANT
TRAY FOLEY CATH 14FRSI W/METER (CATHETERS) IMPLANT
TROCAR ADV FIXATION 12X100MM (TROCAR) ×3 IMPLANT
TROCAR ADV FIXATION 5X100MM (TROCAR) ×3 IMPLANT
TROCAR BLADELESS 15MM (ENDOMECHANICALS) ×3 IMPLANT
TROCAR BLADELESS OPT 5 100 (ENDOMECHANICALS) ×3 IMPLANT
TROCAR BLADELESS OPT 5 150 (ENDOMECHANICALS) ×9 IMPLANT
TUBE CALIBRATION LAPBAND (TUBING) IMPLANT
TUBING CONNECTING 10 (TUBING) ×2 IMPLANT
TUBING CONNECTING 10' (TUBING) ×1
TUBING ENDO SMARTCAP (MISCELLANEOUS) ×3 IMPLANT
TUBING FILTER THERMOFLATOR (ELECTROSURGICAL) ×3 IMPLANT

## 2014-07-09 NOTE — Anesthesia Preprocedure Evaluation (Addendum)
Anesthesia Evaluation  Patient identified by MRN, date of birth, ID band Patient awake    Reviewed: Allergy & Precautions, H&P , NPO status , Patient's Chart, lab work & pertinent test results, reviewed documented beta blocker date and time   History of Anesthesia Complications Negative for: history of anesthetic complications  Airway Mallampati: I TM Distance: >3 FB Neck ROM: Full   Comment: MP 1, large mouth opening TMD >6cm Dental no notable dental hx. (+) Dental Advisory Given,    Pulmonary neg pulmonary ROS,  breath sounds clear to auscultation  Pulmonary exam normal       Cardiovascular hypertension, Pt. on medications and Pt. on home beta blockers Rhythm:Regular Rate:Normal  Took coreg this am   Neuro/Psych negative neurological ROS  negative psych ROS   GI/Hepatic negative GI ROS, Neg liver ROS,   Endo/Other  Morbid obesity  Renal/GU negative Renal ROS  negative genitourinary   Musculoskeletal negative musculoskeletal ROS (+)   Abdominal (+) + obese,   Peds negative pediatric ROS (+)  Hematology negative hematology ROS (+)   Anesthesia Other Findings   Reproductive/Obstetrics negative OB ROS                        Anesthesia Physical Anesthesia Plan  ASA: III  Anesthesia Plan: General   Post-op Pain Management:    Induction: Intravenous  Airway Management Planned: Oral ETT  Additional Equipment:   Intra-op Plan:   Post-operative Plan: Extubation in OR  Informed Consent: I have reviewed the patients History and Physical, chart, labs and discussed the procedure including the risks, benefits and alternatives for the proposed anesthesia with the patient or authorized representative who has indicated his/her understanding and acceptance.   Dental advisory given  Plan Discussed with: CRNA  Anesthesia Plan Comments:         Anesthesia Quick Evaluation

## 2014-07-09 NOTE — Anesthesia Postprocedure Evaluation (Signed)
  Anesthesia Post-op Note  Patient: Kimberly Boone  Procedure(s) Performed: Procedure(s) (LRB): LAPAROSCOPIC GASTRIC SLEEVE RESECTION WITH UPPER ENDOSCOPY (N/A)  Patient Location: PACU  Anesthesia Type: General  Level of Consciousness: awake and alert   Airway and Oxygen Therapy: Patient Spontanous Breathing  Post-op Pain: mild  Post-op Assessment: Post-op Vital signs reviewed, Patient's Cardiovascular Status Stable, Respiratory Function Stable, Patent Airway and No signs of Nausea or vomiting  Last Vitals:  Filed Vitals:   07/09/14 0742  BP: 133/77  Pulse: 74  Temp: 36.7 C  Resp: 18    Post-op Vital Signs: stable   Complications: No apparent anesthesia complications

## 2014-07-09 NOTE — Op Note (Signed)
Kimberly Boone [id number] 04/11/1986 07/09/2014  Preoperative diagnosis: morbid obesity  Postoperative diagnosis: Same   Procedure: Esophagogastroduodenoscopy   Surgeon: Mary SellaEric M. Jairy Angulo M.D., FACS FASMBS  Anesthesia: Gen.   Indications for procedure: [age]year old female undergoing Laparoscopic Gastric Sleeve Resection and an EGD was requested to evaluate the new gastric sleeve.   Description of procedure: After we have completed the sleeve resection, I scrubbed out and obtained the Olympus endoscope. I gently placed endoscope in the patient's oropharynx and gently glided it down the esophagus without any difficulty under direct visualization. Once I was in the gastric sleeve, I insufflated the stomach with air. I was able to cannulate and advanced the scope through the gastric sleeve. I was able to cannulate the duodenum with ease. Dr. Daphine DeutscherMartin had placed saline in the upper abdomen. Upon further insufflation of the gastric sleeve there was no evidence of bubbles. GE junction located at 40 cm.  Upon further inspection of the gastric sleeve, the mucosa appeared normal. There is no evidence of any mucosal abnormality. The sleeve was widely patent at the angularis. There was no evidence of bleeding. The gastric sleeve was decompressed. The scope was withdrawn. The patient tolerated this portion of the procedure well. Please see Dr Ermalene SearingMartin's operative note for details regarding the laparoscopic gastric sleeve resection.   Mary SellaEric M. Andrey CampanileWilson, MD, FACS  General, Bariatric, & Minimally Invasive Surgery  [facility] [place]

## 2014-07-09 NOTE — Transfer of Care (Signed)
Immediate Anesthesia Transfer of Care Note  Patient: Kimberly Boone  Procedure(s) Performed: Procedure(s): LAPAROSCOPIC GASTRIC SLEEVE RESECTION WITH UPPER ENDOSCOPY (N/A)  Patient Location: PACU  Anesthesia Type:General  Level of Consciousness: Patient easily awoken, sedated, comfortable, cooperative, following commands, responds to stimulation.   Airway & Oxygen Therapy: Patient spontaneously breathing, ventilating well, oxygen via simple oxygen mask.  Post-op Assessment: Report given to PACU RN, vital signs reviewed and stable, moving all extremities.   Post vital signs: Reviewed and stable.  Complications: No apparent anesthesia complications

## 2014-07-09 NOTE — Interval H&P Note (Signed)
History and Physical Interval Note:  07/09/2014 10:35 AM  Kimberly Boone  has presented today for surgery, with the diagnosis of Morbid Obesity  The various methods of treatment have been discussed with the patient and family. After consideration of risks, benefits and other options for treatment, the patient has consented to  Procedure(s): LAPAROSCOPIC GASTRIC SLEEVE RESECTION (N/A) as a surgical intervention .  The patient's history has been reviewed, patient examined, no change in status, stable for surgery.  I have reviewed the patient's chart and labs.  Questions were answered to the patient's satisfaction.     [name]

## 2014-07-09 NOTE — Op Note (Signed)
Surgeon: Wenda LowMatt Kaison Mcparland, MD, FACS  Asst:  Gaynelle AduEric Wilson, MD, FACS  Anes:  General endotracheal  Procedure: Laparoscopic sleeve gastrectomy and upper endoscopy (Dr. Andrey CampanileWilson)  Diagnosis: Morbid obesity-BMI 53  Complications: none  EBL:   30 cc  Description of Procedure:  The patient was take to OR 1 and given general anesthesia.  The abdomen was prepped with PCMX and draped sterilely.  A timeout was performed.  Access to the abdomen was achieved with a long 5 mm Optiview technique through the left upper quadrant.  Following insufflation, the state of the abdomen was found to be free of adhesions but the thickness of her abdominal wall made trocar movement limited.  The ViSiGi 36Fr tube was inserted to deflate the stomach and was pulled back into the esophagus.    The pylorus was identified and we measured 5 cm back and marked the antrum.  At that point we began dissection to take down the greater curvature of the stomach using the Enseal.  This dissection was taken all the way up to the left crus.  Posterior attachments of the stomach were also taken down.    The ViSiGi tube was then passed into the antrum and suction applied so that it was snug along the lessor curvature.  The "crow's foot" or incisura was identified.  The sleeve gastrectomy was begun using the [employer] platform stapler beginning with a 4.5 mm black cartrige with TRS and continue with two other black loads and then purple loads all with TRS to complete the sleeve.  .  When the sleeve was complete the tube was taken off suction and insufflated briefly.  The tube was withdrawn.  Upper endoscopy was then performed by Dr. Andrey CampanileWilson and no bleeding or bubbles were noted.  .     The specimen was extracted through the 15 trocar site.  Wounds were infiltrated with Exparel and closed with 4-0 Vicryl and Dermabond.    Matt B. Daphine DeutscherMartin, MD, [facility], [place] [phone]

## 2014-07-10 ENCOUNTER — Encounter (HOSPITAL_COMMUNITY): Payer: Self-pay | Admitting: Surgery

## 2014-07-10 ENCOUNTER — Inpatient Hospital Stay (HOSPITAL_COMMUNITY): Payer: BC Managed Care – PPO

## 2014-07-10 DIAGNOSIS — Z09 Encounter for follow-up examination after completed treatment for conditions other than malignant neoplasm: Secondary | ICD-10-CM

## 2014-07-10 LAB — HEMOGLOBIN AND HEMATOCRIT, BLOOD
HCT: 35.5 % — ABNORMAL LOW (ref 36.0–46.0)
HEMOGLOBIN: 11.9 g/dL — AB (ref 12.0–15.0)

## 2014-07-10 LAB — CBC WITH DIFFERENTIAL/PLATELET
BASOS PCT: 0 % (ref 0–1)
Basophils Absolute: 0 10*3/uL (ref 0.0–0.1)
EOS ABS: 0 10*3/uL (ref 0.0–0.7)
EOS PCT: 0 % (ref 0–5)
HCT: 35.1 % — ABNORMAL LOW (ref 36.0–46.0)
Hemoglobin: 11.7 g/dL — ABNORMAL LOW (ref 12.0–15.0)
LYMPHS PCT: 9 % — AB (ref 12–46)
Lymphs Abs: 0.8 10*3/uL (ref 0.7–4.0)
MCH: 27.1 pg (ref 26.0–34.0)
MCHC: 33.3 g/dL (ref 30.0–36.0)
MCV: 81.3 fL (ref 78.0–100.0)
Monocytes Absolute: 0.7 10*3/uL (ref 0.1–1.0)
Monocytes Relative: 8 % (ref 3–12)
Neutro Abs: 7.7 10*3/uL (ref 1.7–7.7)
Neutrophils Relative %: 83 % — ABNORMAL HIGH (ref 43–77)
PLATELETS: 195 10*3/uL (ref 150–400)
RBC: 4.32 MIL/uL (ref 3.87–5.11)
RDW: 13 % (ref 11.5–15.5)
WBC: 9.2 10*3/uL (ref 4.0–10.5)

## 2014-07-10 MED ORDER — PROMETHAZINE HCL 25 MG RE SUPP
25.0000 mg | Freq: Four times a day (QID) | RECTAL | Status: DC | PRN
  Administered 2014-07-10: 25 mg via RECTAL
  Filled 2014-07-10: qty 1

## 2014-07-10 MED ORDER — PROMETHAZINE HCL 25 MG/ML IJ SOLN
12.5000 mg | INTRAMUSCULAR | Status: DC | PRN
  Administered 2014-07-10: 12.5 mg via INTRAVENOUS
  Filled 2014-07-10: qty 1

## 2014-07-10 MED ORDER — IOHEXOL 300 MG/ML  SOLN
50.0000 mL | Freq: Once | INTRAMUSCULAR | Status: AC | PRN
  Administered 2014-07-10: 50 mL via ORAL

## 2014-07-10 NOTE — Progress Notes (Signed)
Patient ID: Kimberly Boone, female   DOB: 1985/11/11, 28 y.o.   MRN: [id number] [facility] Surgery Progress Note:   1 Day Post-Op  Subjective: Mental status is clear.  Nauseated this am Objective: Vital signs in last 24 hours: Temp:  [98.6 F (37 C)-99.6 F (37.6 C)] 98.8 F (37.1 C) (10/21 2116) Pulse Rate:  [66-77] 77 (10/21 2116) Resp:  [18] 18 (10/21 2116) BP: (117-160)/(61-98) 152/93 mmHg (10/21 2116) SpO2:  [94 %-99 %] 97 % (10/21 2116)  Intake/Output from previous day: 10/20 0701 - 10/21 0700 In: 3835 [I.V.:3835] Out: 1750 [Urine:1650; Emesis/NG output:100] Intake/Output this shift: Total I/O In: 60 [P.O.:60] Out: 1 [Emesis/NG output:1]  Physical Exam: Work of breathing is not labored.  Had some bilious vomiting   Lab Results:  Results for orders placed during the hospital encounter of 07/09/14 (from the past 48 hour(s))  HEMOGLOBIN AND HEMATOCRIT, BLOOD     Status: Abnormal   Collection Time    07/09/14  1:55 PM      Result Value Ref Range   Hemoglobin 11.8 (*) 12.0 - 15.0 g/dL   HCT 36.0  36.0 - 46.0 %  CBC     Status: Abnormal   Collection Time    07/09/14  3:21 PM      Result Value Ref Range   WBC 13.6 (*) 4.0 - 10.5 K/uL   RBC 4.60  3.87 - 5.11 MIL/uL   Hemoglobin 12.7  12.0 - 15.0 g/dL   HCT 37.4  36.0 - 46.0 %   MCV 81.3  78.0 - 100.0 fL   MCH 27.6  26.0 - 34.0 pg   MCHC 34.0  30.0 - 36.0 g/dL   RDW 13.1  11.5 - 15.5 %   Platelets 217  150 - 400 K/uL  CREATININE, SERUM     Status: None   Collection Time    07/09/14  3:21 PM      Result Value Ref Range   Creatinine, Ser 0.69  0.50 - 1.10 mg/dL   GFR calc non Af Amer >90  >90 mL/min   GFR calc Af Amer >90  >90 mL/min   Comment: (NOTE)     The eGFR has been calculated using the CKD EPI equation.     This calculation has not been validated in all clinical situations.     eGFR's persistently <90 mL/min signify possible Chronic Kidney     Disease.  CBC WITH DIFFERENTIAL     Status: Abnormal    Collection Time    07/10/14  4:30 AM      Result Value Ref Range   WBC 9.2  4.0 - 10.5 K/uL   RBC 4.32  3.87 - 5.11 MIL/uL   Hemoglobin 11.7 (*) 12.0 - 15.0 g/dL   HCT 35.1 (*) 36.0 - 46.0 %   MCV 81.3  78.0 - 100.0 fL   MCH 27.1  26.0 - 34.0 pg   MCHC 33.3  30.0 - 36.0 g/dL   RDW 13.0  11.5 - 15.5 %   Platelets 195  150 - 400 K/uL   Neutrophils Relative % 83 (*) 43 - 77 %   Neutro Abs 7.7  1.7 - 7.7 K/uL   Lymphocytes Relative 9 (*) 12 - 46 %   Lymphs Abs 0.8  0.7 - 4.0 K/uL   Monocytes Relative 8  3 - 12 %   Monocytes Absolute 0.7  0.1 - 1.0 K/uL   Eosinophils Relative 0  0 - 5 %  Eosinophils Absolute 0.0  0.0 - 0.7 K/uL   Basophils Relative 0  0 - 1 %   Basophils Absolute 0.0  0.0 - 0.1 K/uL  HEMOGLOBIN AND HEMATOCRIT, BLOOD     Status: Abnormal   Collection Time    07/10/14  4:44 PM      Result Value Ref Range   Hemoglobin 11.9 (*) 12.0 - 15.0 g/dL   HCT 35.5 (*) 36.0 - 46.0 %    Radiology/Results: Dg Ugi W/water Sol Cm  07/10/2014   CLINICAL DATA:  Postop gastric sleeve  EXAM: WATER SOLUBLE UPPER GI SERIES  TECHNIQUE: Single-column upper GI series was performed using water soluble contrast.  CONTRAST:  82m OMNIPAQUE IOHEXOL 300 MG/ML  SOLN  COMPARISON:  04/24/2014  FLUOROSCOPY TIME:  0 min 29 seconds  FINDINGS: Distal esophagus is normal. Contrast enters the stomach without delay. Satisfactory gastric sleeve appearance. No leak or obstruction. Stomach empties readily into the duodenum. No duodenum obstruction or leak is identified.  IMPRESSION: Satisfactory gastric sleeve procedure.  No obstruction or leak.   Electronically Signed   By: CFranchot GalloM.D.   On: 07/10/2014 09:19    Anti-infectives: Anti-infectives   Start     Dose/Rate Route Frequency Ordered Stop   07/09/14 0830  cefOXitin (MEFOXIN) 2 g in dextrose 5 % 50 mL IVPB     2 g 100 mL/hr over 30 Minutes Intravenous On call to O.R. 07/09/14 0820 07/09/14 1111      Assessment/Plan: Problem  List: Patient Active Problem List   Diagnosis Date Noted  . 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 Morbid obesity with BMI of 50.0-59.9, adult 07/09/2014  . History of laparoscopic cholecystectomy 03/21/2014  . Essential hypertension, benign 03/21/2014    UGI OK.  Start clears.   1 Day Post-Op    LOS: 1 day   Matt B. MHassell Done MD, FKeck [facility] [phone] [phone] 07/10/2014 11:43 PM

## 2014-07-10 NOTE — Plan of Care (Signed)
Problem: Food- and Nutrition-Related Knowledge Deficit (NB-1.1) Goal: Nutrition education Formal process to instruct or train a patient/client in a skill or to impart knowledge to help patients/clients voluntarily manage or modify food choices and eating behavior to maintain or improve health. Outcome: Completed/Met Date Met:  07/10/14 Nutrition Education Note  Received consult for diet education per DROP protocol.   S/P Laparoscopic sleeve gastrectomy 10/20  Discussed 2 week post op diet with pt. Emphasized that liquids must be non carbonated, non caffeinated, and sugar free. Fluid goals discussed. Pt to follow up with outpatient bariatric RD for further diet progression after 2 weeks. Multivitamins and minerals also reviewed. Teach back method used, pt expressed understanding, expect good compliance.   Diet: First 2 Weeks  You will see the nutritionist about two (2) weeks after your surgery. The nutritionist will increase the types of foods you can eat if you are handling liquids well:  If you have severe vomiting or nausea and cannot handle clear liquids lasting longer than 1 day, call your surgeon  Protein Shake  Drink at least 2 ounces of shake 5-6 times per day  Each serving of protein shakes (usually 8 - 12 ounces) should have a minimum of:  15 grams of protein  And no more than 5 grams of carbohydrate  Goal for protein each day:  Men = 80 grams per day  Women = 60 grams per day  Protein powder may be added to fluids such as non-fat milk or Lactaid milk or Soy milk (limit to 35 grams added protein powder per serving)   Hydration  Slowly increase the amount of water and other clear liquids as tolerated (See Acceptable Fluids)  Slowly increase the amount of protein shake as tolerated  Sip fluids slowly and throughout the day  May use sugar substitutes in small amounts (no more than 6 - 8 packets per day; i.e. Splenda)   Fluid Goal  The first goal is to drink at least 8 ounces of  protein shake/drink per day (or as directed by the nutritionist); some examples of protein shakes are [employer], AMR [employer], EAS Edge HP, and Unjury. See handout from pre-op Bariatric Education Class:  Slowly increase the amount of protein shake you drink as tolerated  You may find it easier to slowly sip shakes throughout the day  It is important to get your proteins in first  Your fluid goal is to drink 64 - 100 ounces of fluid daily  It may take a few weeks to build up to this  32 oz (or more) should be clear liquids  And  32 oz (or more) should be full liquids (see below for examples)  Liquids should not contain sugar, caffeine, or carbonation   Clear Liquids:  Water or Sugar-free flavored water (i.e. Fruit H2O, Propel)  Decaffeinated coffee or tea (sugar-free)  Crystal Lite, Wyler's Lite, Minute Maid Lite  Sugar-free Jell-O  Bouillon or broth  Sugar-free Popsicle: *Less than 20 calories each; Limit 1 per day   Full Liquids:  Protein Shakes/Drinks + 2 choices per day of other full liquids  Full liquids must be:  No More Than 12 grams of Carbs per serving  No More Than 3 grams of Fat per serving  Strained low-fat cream soup  Non-Fat milk  Fat-free Lactaid Milk  Sugar-free yogurt (Dannon Lite & Fit, Greek yogurt)    Clayton Bibles, MS, RD, LDN Pager: [phone] After Hours Pager: [phone]

## 2014-07-10 NOTE — Progress Notes (Signed)
Patient given water 2Oz able to keep fluid down for 45 minutes.  Patient attempted the Roxicodone which she immediately vomited.  Patient got up to see if that would help with pain and nausea.  Started to have hiccups.  Patient states she need nausea medication and then if feels better in an hour follow up with pain meds

## 2014-07-10 NOTE — Progress Notes (Signed)
VASCULAR LAB PRELIMINARY  PRELIMINARY  PRELIMINARY  PRELIMINARY  Bilateral lower extremity venous duplex completed.    Preliminary report:  Bilateral:  No evidence of DVT, superficial thrombosis, or Baker's Cyst.   Ashonti Leandro, RVS 07/10/2014, 12:26 PM

## 2014-07-10 NOTE — Progress Notes (Signed)
Patient alert and oriented, Post op day 1.  Provided support and encouragement.  Encouraged pulmonary toilet, ambulation and small sips of liquids.  All questions answered.  Will continue to monitor. 

## 2014-07-10 NOTE — Progress Notes (Addendum)
Dr Daphine DeutscherMartin paged per office,  pt continuing to vomit despite the IV Phenergen she received.  Pt UGI back with no obstruction or leak.  Await response.  1016 Dr Daphine DeutscherMartin called from OR via Lela RN regarding above.  Ordered to let patient sip on water and to place suppository order of phenergen

## 2014-07-11 LAB — CBC WITH DIFFERENTIAL/PLATELET
BASOS PCT: 0 % (ref 0–1)
Basophils Absolute: 0 10*3/uL (ref 0.0–0.1)
EOS PCT: 0 % (ref 0–5)
Eosinophils Absolute: 0 10*3/uL (ref 0.0–0.7)
HCT: 34.2 % — ABNORMAL LOW (ref 36.0–46.0)
Hemoglobin: 11.3 g/dL — ABNORMAL LOW (ref 12.0–15.0)
Lymphocytes Relative: 31 % (ref 12–46)
Lymphs Abs: 2.4 10*3/uL (ref 0.7–4.0)
MCH: 27.4 pg (ref 26.0–34.0)
MCHC: 33 g/dL (ref 30.0–36.0)
MCV: 82.8 fL (ref 78.0–100.0)
Monocytes Absolute: 0.7 10*3/uL (ref 0.1–1.0)
Monocytes Relative: 9 % (ref 3–12)
NEUTROS PCT: 60 % (ref 43–77)
Neutro Abs: 4.7 10*3/uL (ref 1.7–7.7)
Platelets: 173 10*3/uL (ref 150–400)
RBC: 4.13 MIL/uL (ref 3.87–5.11)
RDW: 13.4 % (ref 11.5–15.5)
WBC: 7.8 10*3/uL (ref 4.0–10.5)

## 2014-07-11 MED ORDER — PROMETHAZINE HCL 25 MG RE SUPP: 25.0000 mg | Freq: Four times a day (QID) | RECTAL | Status: DC | PRN

## 2014-07-11 NOTE — Discharge Summary (Signed)
Physician Discharge Summary  Patient ID: Chandra BatchQuintara A Keown MRN: [id number] DOB/AGE: 03/26/1986 26 y.o.  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  PD 1 had UGI which looked OK.  Had issues with nausea.  This got better and she was ready for discharge  Consults: none  Significant Diagnostic Studies: UGI    Discharge Exam: Blood pressure 150/82, pulse 63, temperature 98.7 F (37.1 C), temperature source Oral, resp. rate 18, height 5\' 6"  (1.676 m), weight 325 lb (147.419 kg), last menstrual period 07/08/2014, SpO2 98.00%. Incisions OK  Disposition: 01-Home or Self Care  Discharge Instructions   Discharge instructions    Complete by:  As directed   Follow bariatric dietary guidelines     Increase activity slowly    Complete by:  As directed             Medication List         BIOTIN PO  Take 1 tablet by mouth every morning.     CALCIUM-VITAMIN D PO  Take 1 tablet by mouth every morning.     carvedilol 6.25 MG tablet  Commonly known as:  COREG  Take 6.25 mg by mouth 2 (two) times daily with a meal.     FOLIC ACID PO  Take 1 tablet by mouth every morning.     HM MULTIVITAMIN ADULT GUMMY Chew  Chew 1 tablet by mouth every morning.         SignedValarie Merino: Winn Muehl B 07/11/2014, 4:54 PM

## 2014-07-11 NOTE — Discharge Instructions (Signed)

## 2014-07-11 NOTE — Progress Notes (Signed)
Patient alert and oriented, pain is controlled. Patient is tolerating fluids, advanced to protein shake today, tolerated well.  Reviewed Gastric sleeve discharge instructions with patient and patient is able to articulate understanding.  Provided information on BELT program, Support Group and [facility] outpatient pharmacy. All questions answered, will continue to monitor.  

## 2014-07-12 ENCOUNTER — Telehealth (HOSPITAL_COMMUNITY): Payer: Self-pay

## 2014-07-12 NOTE — Telephone Encounter (Addendum)
Attempted DROP discharge phone call, no answer, left message to return call, 07/12/2014 @1 :44  Made discharge phone call to patient per DROP protocol. Asking the following questions.    1. Do you have someone to care for you now that you are home?  yes 2. Are you having pain now that is not relieved by your pain medication?  no 3. Are you able to drink the recommended daily amount of fluids (48 ounces minimum/day) and protein (60-80 grams/day) as prescribed by the dietitian or nutritional counselor?  trying 4. Are you taking the vitamins and minerals as prescribed?  yes 5. Do you have the "on call" number to contact your surgeon if you have a problem or question?  yes 6. Are your incisions free of redness, swelling or drainage? (If steri strips, address that these can fall off, shower as tolerated) yes 7. Have your bowels moved since your surgery?  If not, are you passing gas?  yes 8. Are you up and walking 3-4 times per day?  yes    1. Do you have an appointment made to see your surgeon in the next month?  yes 2. Were you provided your discharge medications before your surgery or before you were discharged from the hospital and are you taking them without problem?  yes 3. Were you provided phone numbers to the clinic/surgeon's office?  yes 4. Did you watch the patient education video module in the (clinic, surgeon's office, etc.) before your surgery? yes 5. Do you have a discharge checklist that was provided to you in the hospital to reference with instructions on how to take care of yourself after surgery?  yes 6. Did you see a dietitian or nutritional counselor while you were in the hospital?  yes 7. Do you have an appointment to see a dietitian or nutritional counselor in the next month?  yes

## 2014-07-19 ENCOUNTER — Encounter: Payer: BC Managed Care – PPO | Attending: Surgery

## 2014-07-19 VITALS — Ht 66.0 in | Wt 314.0 lb

## 2014-07-19 DIAGNOSIS — Z713 Dietary counseling and surveillance: Secondary | ICD-10-CM | POA: Insufficient documentation

## 2014-07-19 DIAGNOSIS — Z6841 Body Mass Index (BMI) 40.0 and over, adult: Secondary | ICD-10-CM | POA: Insufficient documentation

## 2014-07-19 NOTE — Progress Notes (Signed)
  Bariatric Class:  Appt start time: 1000 end time:  1100.  2 Week Post-Operative Nutrition Class  Patient was seen on 07/19/2014 for Post-Operative Nutrition education [facility].   Surgery date: 07/09/2014 Surgery type: Gastric sleeve Start weight at [facility]: 332.5 lbs on 04/27/2014 Weight today: 314.0 lbs  Weight change: 21 lbs  TANITA  BODY COMP RESULTS  06/17/14 07/19/14   BMI (kg/m^2) 54.1 50.7   Fat Mass (lbs) 188.5 184.5   Fat Free Mass (lbs) 146.5 129.5   Total Body Water (lbs) 107.5 95.0    The following the learning objectives were met by the patient during this course:  Identifies Phase 3A (Soft, High Proteins) Dietary Goals and will begin from 2 weeks post-operatively to 2 months post-operatively  Identifies appropriate sources of fluids and proteins   States protein recommendations and appropriate sources post-operatively  Identifies the need for appropriate texture modifications, mastication, and bite sizes when consuming solids  Identifies appropriate multivitamin and calcium sources post-operatively  Describes the need for physical activity post-operatively and will follow MD recommendations  States when to call healthcare provider regarding medication questions or post-operative complications  Handouts given during class include:  Phase 3A: Soft, High Protein Diet Handout  Follow-Up Plan: Patient will follow-up at [facility] in 6 weeks for 2 month post-op nutrition visit for diet advancement per MD.

## 2014-07-23 ENCOUNTER — Ambulatory Visit: Payer: BC Managed Care – PPO

## 2014-09-02 ENCOUNTER — Ambulatory Visit: Payer: BC Managed Care – PPO | Admitting: Dietician

## 2014-09-17 ENCOUNTER — Encounter: Payer: BC Managed Care – PPO | Attending: Surgery | Admitting: Dietician

## 2014-09-17 VITALS — Ht 66.0 in | Wt 293.0 lb

## 2014-09-17 DIAGNOSIS — Z6841 Body Mass Index (BMI) 40.0 and over, adult: Secondary | ICD-10-CM | POA: Diagnosis not present

## 2014-09-17 DIAGNOSIS — Z713 Dietary counseling and surveillance: Secondary | ICD-10-CM | POA: Insufficient documentation

## 2014-09-17 NOTE — Patient Instructions (Addendum)
Goals:  Follow Phase 3B: High Protein + Non-Starchy Vegetables  Eat 3-6 small meals/snacks, every 3-5 hrs  Increase lean protein foods to meet 60g goal  Increase fluid intake to 64oz +  Avoid drinking 15 minutes before, during and 30 minutes after eating  Aim for >30 min of physical activity daily  Add coffee back  Try lean cuisine or healthy choice frozen meals (meat and vegetables)  Increase fluid and add veggies   Keep carbs to 15g per meal   TANITA  BODY COMP RESULTS  06/17/14 07/19/14 09/17/14   BMI (kg/m^2) 54.1 50.7 47.3   Fat Mass (lbs) 188.5 184.5 165.5   Fat Free Mass (lbs) 146.5 129.5 127.5   Total Body Water (lbs) 107.5 95.0 93.5

## 2014-09-17 NOTE — Progress Notes (Signed)
  Follow-up visit:  8 Weeks Post-Operative Gastric sleeve Surgery  Medical Nutrition Therapy:  Appt start time: 0900 end time:  0930.  Primary concerns today: Post-operative Bariatric Surgery Nutrition Management.  [name] returns today having lost another 21 lbs since post op class. She reports having hard stools and cramping when having a bowel movement. Only has a BM 1-2x a week. Taking Miralax. She states that she is not having feelings of hunger and has to make herself eat. Aversion to eggs, yogurt, milky foods, pork, and sweets.    Surgery date: 07/09/2014 Surgery type: Gastric sleeve [facility] lbs on 04/27/2014 Weight today: 293 lbs Weight change: 21 lbs Total weight lost: 39.5 lbs  TANITA  BODY COMP RESULTS  06/17/14 07/19/14 09/17/14   BMI (kg/m^2) 54.1 50.7 47.3   Fat Mass (lbs) 188.5 184.5 165.5   Fat Free Mass (lbs) 146.5 129.5 127.5   Total Body Water (lbs) 107.5 95.0 93.5    Preferred Learning Style:   No preference indicated   Learning Readiness:   Ready  24-hr recall: B (AM): Premier protein shake (30g) Snk (AM): sometimes, reduced fat cheese (5g)  L (PM): Atkins frozen dinner Snk (PM): Atkins bar  D (PM): 2 oz baked chicken or beef or fish (14g) Snk (PM): no  Fluid intake: 40-50 oz (water, powerade zero, protein shake, green tea) Estimated total protein intake: ~60g  Medications: see list (blood pressure is improving) Supplementation: taking (difficulty finding Calcium supplement she likes)  Using straws: no Drinking while eating: no Hair loss: yes Carbonated beverages: yes (sparkling water) N/V/D/C: vomited 1x after eating a [place] with bread; constipation Dumping syndrome: no  Recent physical activity:  walking  Progress Towards Goal(s):  In progress.  Handouts given during visit include:  Phase 3B lean protein + non starchy vegetables  Bariatric snack list   Nutritional Diagnosis:  [place]-3.3 Overweight/obesity  related to past poor dietary habits and physical inactivity as evidenced by patient w/ recent gastric sleeve surgery following dietary guidelines for continued weight loss.   Intervention:  Nutrition counseling/diet advancement. Goals:  Follow Phase 3B: High Protein + Non-Starchy Vegetables  Eat 3-6 small meals/snacks, every 3-5 hrs  Increase lean protein foods to meet 60g goal  Increase fluid intake to 64oz +  Avoid drinking 15 minutes before, during and 30 minutes after eating  Aim for >30 min of physical activity daily  Add coffee back  Try lean cuisine or healthy choice frozen meals (meat and vegetables)  Increase fluid and add veggies   Keep carbs to 15g per meal  Teaching Method Utilized:  Visual Auditory Hands on  Barriers to learning/adherence to lifestyle change: none  Demonstrated degree of understanding via:  Teach Back   Monitoring/Evaluation:  Dietary intake, exercise, and body weight. Follow up in 2 months for 4 month post-op visit.

## 2014-11-18 ENCOUNTER — Encounter: Payer: BLUE CROSS/BLUE SHIELD | Attending: Surgery | Admitting: Dietician

## 2014-11-18 VITALS — Ht 66.0 in | Wt 279.0 lb

## 2014-11-18 DIAGNOSIS — Z6841 Body Mass Index (BMI) 40.0 and over, adult: Secondary | ICD-10-CM | POA: Diagnosis not present

## 2014-11-18 DIAGNOSIS — Z713 Dietary counseling and surveillance: Secondary | ICD-10-CM | POA: Insufficient documentation

## 2014-11-18 NOTE — Patient Instructions (Addendum)
Goals:  Follow Phase 3B: High Protein + Non-Starchy Vegetables  Eat 3-6 small meals/snacks, every 3-5 hrs  Increase lean protein foods to meet 60g goal  Increase fluid intake to 64oz +  Avoid drinking 15 minutes before, during and 30 minutes after eating  Try lean cuisine or healthy choice frozen meals (meat and vegetables)  Increase fluid and add veggies   Keep carbs to 15g per meal (example: handful of berries or fist-sized piece of fruit)  Set alarm for Calcium (try adding a daily magnesium supplement)  Add Fage yogurt and avoid using all the fruit filling   TANITA  BODY COMP RESULTS  06/17/14 07/19/14 09/17/14 11/18/14   BMI (kg/m^2) 54.1 50.7 47.3 45   Fat Mass (lbs) 188.5 184.5 165.5 149.5   Fat Free Mass (lbs) 146.5 129.5 127.5 129.5   Total Body Water (lbs) 107.5 95.0 93.5 95

## 2014-11-18 NOTE — Progress Notes (Signed)
  Follow-up visit:  4 months Post-Operative Gastric sleeve Surgery  Medical Nutrition Therapy:  Appt start time: 835 end time:  900  Primary concerns today: Post-operative Bariatric Surgery Nutrition Management.  [name] returns today having lost another 16 lbs of fat in the last 2 months. She has noticed that her weight loss has slowed.Trying not to slip into old habits and still avoiding bread and fruit. Beef and fish are most easily tolerated. Plans to begin weighing her food again. Has been meal prepping. Having 500-700 calories per day. Logs foods during the week.    Surgery date: 07/09/2014 Surgery type: [facility]astric sleeve [facility] on 04/27/2014 Weight today: 279 lbs Weight change: 14 lbs Total weight lost: 53.5 lbs  TANITA  BODY COMP RESULTS  06/17/14 07/19/14 09/17/14 11/18/14   BMI (kg/m^2) 54.1 50.7 47.3 45   Fat Mass (lbs) 188.5 184.5 165.5 149.5   Fat Free Mass (lbs) 146.5 129.5 127.5 129.5   Total Body Water (lbs) 107.5 95.0 93.5 95    Preferred Learning Style:   No preference indicated   Learning Readiness:   Ready  24-hr recall: B (AM): Premier protein shake, sometimes mixed with coffee (30g) Snk (AM):  L (PM): Lean cuisine OR chicken OR salad with protein Snk (PM): Atkins bar  D (PM): 2-3 oz steak or salad or 3 baked wings Snk (PM): no  Fluid intake: 40-50 oz (water, powerade zero, protein shake, green tea); working on increasing Estimated total protein intake: 60 grams if she has a shake  Medications: see list (blood pressure is improving) Supplementation: taking  Using straws: no Drinking while eating: no Hair loss: yes Carbonated beverages: yes (sparkling water) N/V/D/C: constipation, taking Miralax Dumping syndrome: no  Recent physical activity:  Walking and aerobic classes at [employer] (HIIT, zumba, pilates)  Progress Towards Goal(s):  In progress.   Nutritional Diagnosis:  [place]-3.3 Overweight/obesity related to past poor  dietary habits and physical inactivity as evidenced by patient w/ recent gastric sleeve surgery following dietary guidelines for continued weight loss.   Intervention:  Nutrition counseling/diet advancement.  Teaching Method Utilized:  Visual Auditory Hands on  Barriers to learning/adherence to lifestyle change: none  Demonstrated degree of understanding via:  Teach Back   Monitoring/Evaluation:  Dietary intake, exercise, and body weight. Follow up in 2 months for 6 month post-op visit.

## 2015-01-21 ENCOUNTER — Ambulatory Visit: Payer: BLUE CROSS/BLUE SHIELD | Admitting: Dietician

## 2015-02-19 ENCOUNTER — Encounter: Payer: BLUE CROSS/BLUE SHIELD | Attending: Surgery | Admitting: Dietician

## 2015-02-19 ENCOUNTER — Encounter: Payer: Self-pay | Admitting: Dietician

## 2015-02-19 VITALS — Ht 66.0 in | Wt 266.0 lb

## 2015-02-19 DIAGNOSIS — Z6841 Body Mass Index (BMI) 40.0 and over, adult: Secondary | ICD-10-CM | POA: Diagnosis not present

## 2015-02-19 DIAGNOSIS — Z713 Dietary counseling and surveillance: Secondary | ICD-10-CM | POA: Diagnosis not present

## 2015-02-19 NOTE — Progress Notes (Signed)
  Follow-up visit:  8 months Post-Operative Gastric sleeve Surgery  Medical Nutrition Therapy:  Appt start time: 810 end time:  845  Primary concerns today: Post-operative Bariatric Surgery Nutrition Management.  [name] returns today having lost another 15 lbs of fat in the last 3 months. 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 Trying to get back into a routine. Having a hard time finding time to work out. Has an easier time getting enough to eat when she works. She hasn't told many of her friends that she has had surgery and finds it difficult choosing good foods and drinks when she goes out to eat.   Surgery date: 07/09/2014 Surgery type: Gastric sleeve [facility] lbs on 04/27/2014 Weight today: 266 lbs Weight change: 13 lbs Total weight lost: 66.5 lbs  TANITA  BODY COMP RESULTS  06/17/14 07/19/14 09/17/14 11/18/14 02/19/15   BMI (kg/m^2) 54.1 50.7 47.3 45 42.9   Fat Mass (lbs) 188.5 184.5 165.5 149.5 135   Fat Free Mass (lbs) 146.5 129.5 127.5 129.5 131   Total Body Water (lbs) 107.5 95.0 93.5 95 96    Preferred Learning Style:   No preference indicated   Learning Readiness:   Ready  24-hr recall: B (8:30AM): Premier protein shake, sometimes mixed with coffee (30g) Snk (AM): sometimes a Babybel cheese (0-6g) L (PM): chicken thigh and broccoli (14-21g) Snk (PM): rarely, 1/2 protein bar D (PM): 2-3 oz grilled chicken or fish and broccoli or salad (14-21g) Snk (PM): none  Fluid intake: water with Crystal Light and protein shakes (patient estimates greater than 64 oz per day) Estimated total protein intake: 60+ grams if she drinks a shake  Medications: see list (blood pressure is improving), has a physical tomorrow Supplementation: taking  Using straws: yes, no gas pain Drinking while eating: no Hair loss: hair is growing back Carbonated beverages: no N/V/D/C: constipation resolved after adding vegetables Dumping syndrome:  no  Recent physical activity:  Walking and aerobic classes at [employer] (HIIT, zumba, pilates, yoga) 3-4x a week  Progress Towards Goal(s):  In progress.   Nutritional Diagnosis:  [place]-3.3 Overweight/obesity related to past poor dietary habits and physical inactivity as evidenced by patient w/ recent gastric sleeve surgery following dietary guidelines for continued weight loss.   Intervention:  Nutrition counseling/diet advancement.  Teaching Method Utilized:  Visual Auditory Hands on  Barriers to learning/adherence to lifestyle change: none  Demonstrated degree of understanding via:  Teach Back   Monitoring/Evaluation:  Dietary intake, exercise, and body weight. Follow up in 4 months for 12 month post-op visit.

## 2015-02-19 NOTE — Patient Instructions (Addendum)
  Surgery date: 07/09/2014 Surgery type: Gastric sleeve [facility]: 332.5 lbs on 04/27/2014 Weight today: 266 lbs Weight change: 13 lbs Total weight lost: 66.5 lbs   TANITA  BODY COMP RESULTS  06/17/14 07/19/14 09/17/14 11/18/14 02/19/15   BMI (kg/m^2) 54.1 50.7 47.3 45 42.9   Fat Mass (lbs) 188.5 184.5 165.5 149.5 135   Fat Free Mass (lbs) 146.5 129.5 127.5 129.5 131   Total Body Water (lbs) 107.5 95.0 93.5 95 96

## 2015-03-18 ENCOUNTER — Other Ambulatory Visit (HOSPITAL_COMMUNITY)
Admission: RE | Admit: 2015-03-18 | Discharge: 2015-03-18 | Disposition: A | Discharge status: Home / Self Care | Payer: BLUE CROSS/BLUE SHIELD | Source: Ambulatory Visit | Attending: Nurse Practitioner | Admitting: Nurse Practitioner

## 2015-03-18 ENCOUNTER — Other Ambulatory Visit: Payer: Self-pay | Admitting: Nurse Practitioner

## 2015-03-18 DIAGNOSIS — Z01411 Encounter for gynecological examination (general) (routine) with abnormal findings: Secondary | ICD-10-CM | POA: Diagnosis present

## 2015-03-18 DIAGNOSIS — Z113 Encounter for screening for infections with a predominantly sexual mode of transmission: Secondary | ICD-10-CM | POA: Diagnosis present

## 2015-03-19 LAB — CYTOLOGY - PAP

## 2015-06-24 ENCOUNTER — Ambulatory Visit: Payer: BLUE CROSS/BLUE SHIELD | Admitting: Dietician

## 2016-05-03 IMAGING — RF DG UGI W/ GASTROGRAFIN
4 of 5 series · 15 of 24 positions shown · IV contrast (omnipaque)
Comparison: 04/24/2014

FLUOROSCOPY TIME:  0 min 29 seconds

CLINICAL DATA: Postop gastric sleeve

EXAM:
WATER SOLUBLE UPPER GI SERIES
TECHNIQUE: Single-column upper GI series was performed using water soluble
contrast.
CONTRAST:  50mL OMNIPAQUE IOHEXOL 300 MG/ML  SOLN

[Series 1: run · 12 of 35 slices shown (1 of 3)]
[im 1/35]
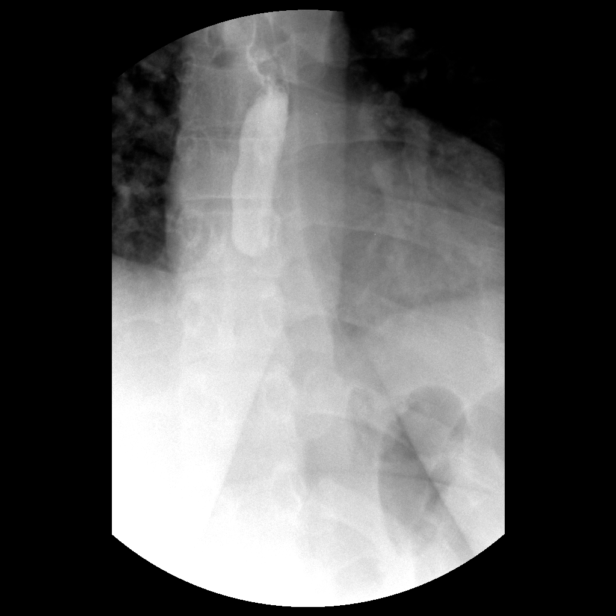
[im 4/35]
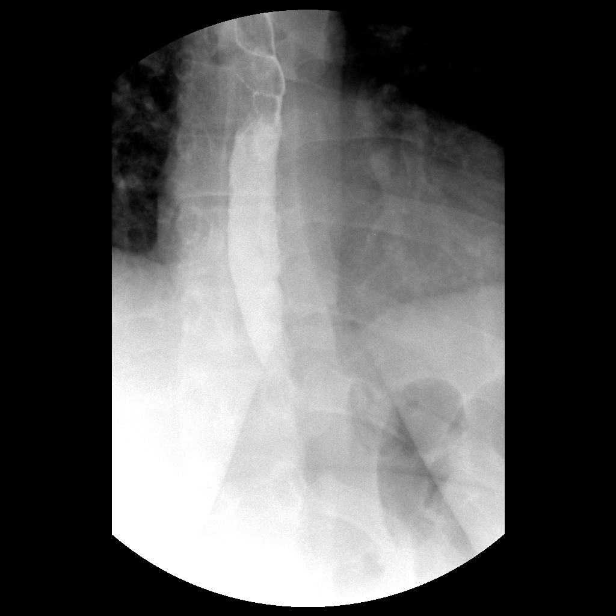
[im 8/35]
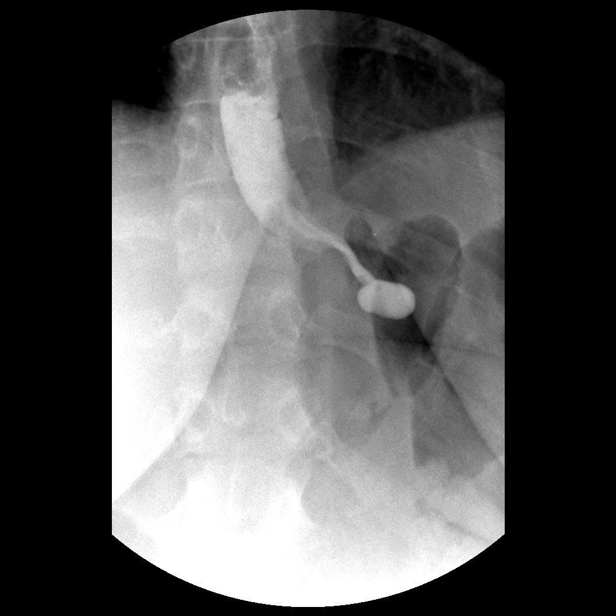
[im 9/35]
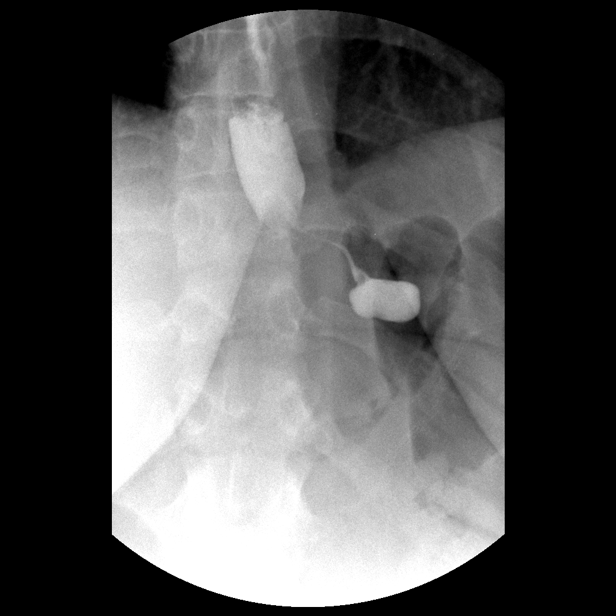
[im 13/35]
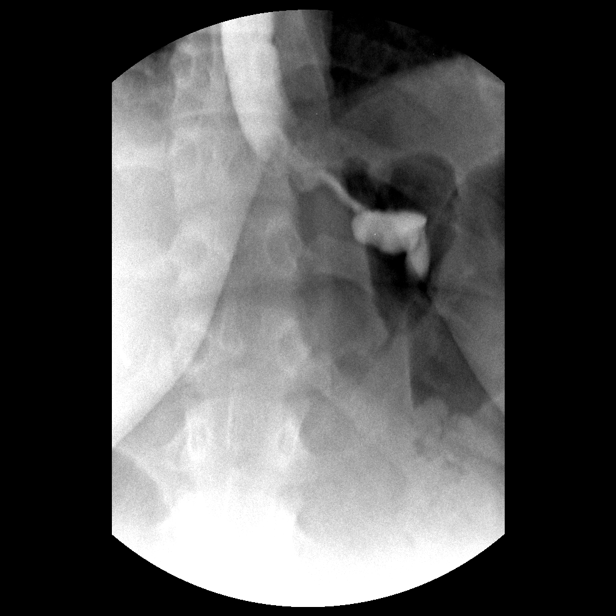
[im 15/35]
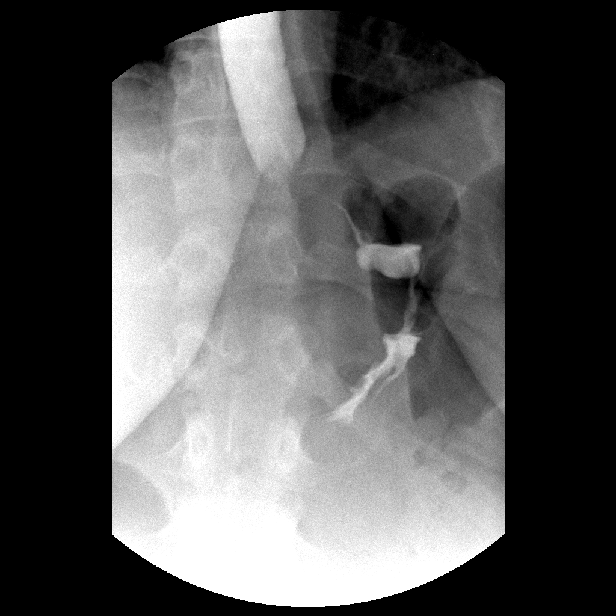
[im 18/35]
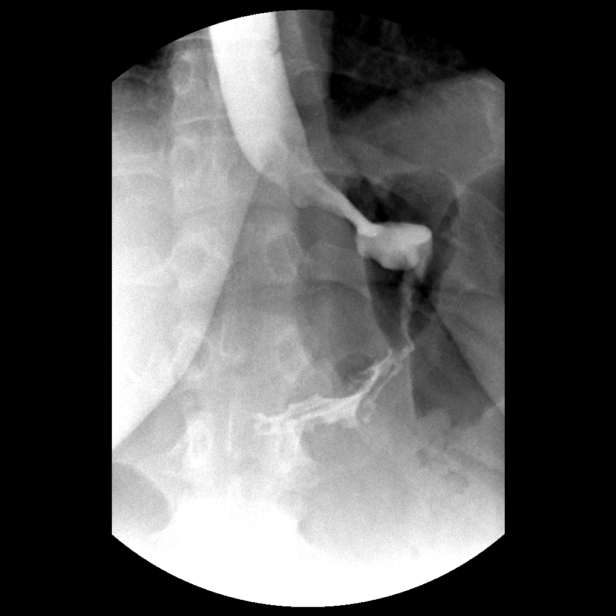
[im 22/35]
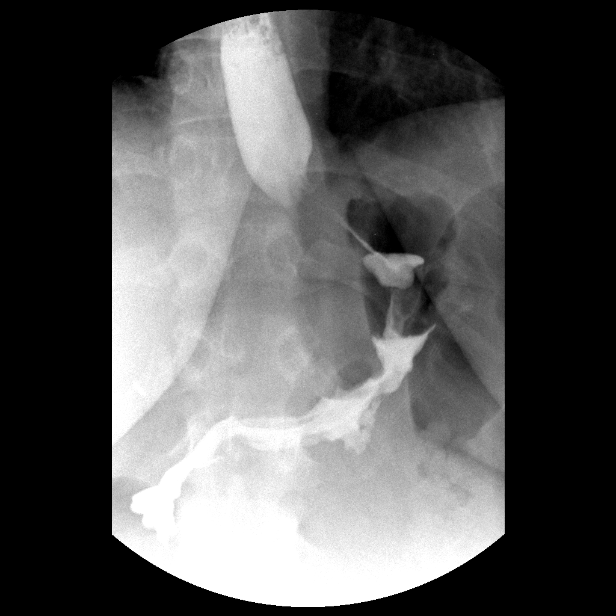
[im 24/35]
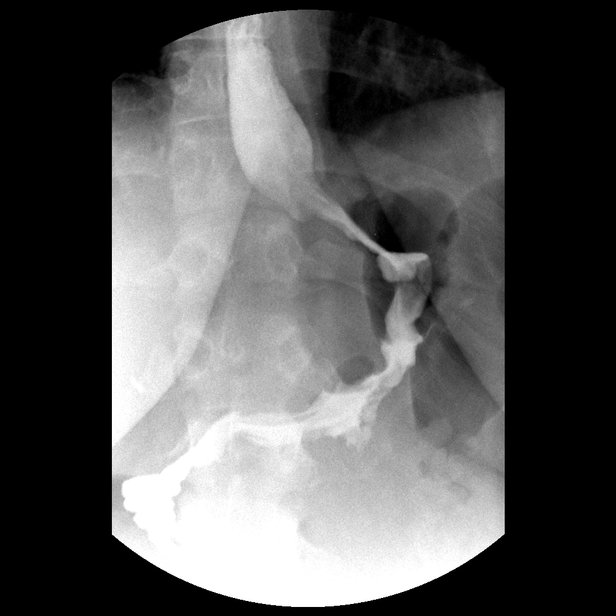
[im 27/35]
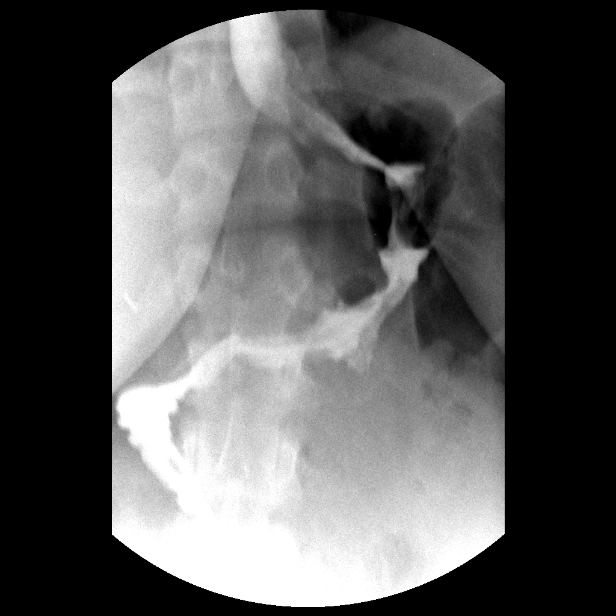
[im 29/35]
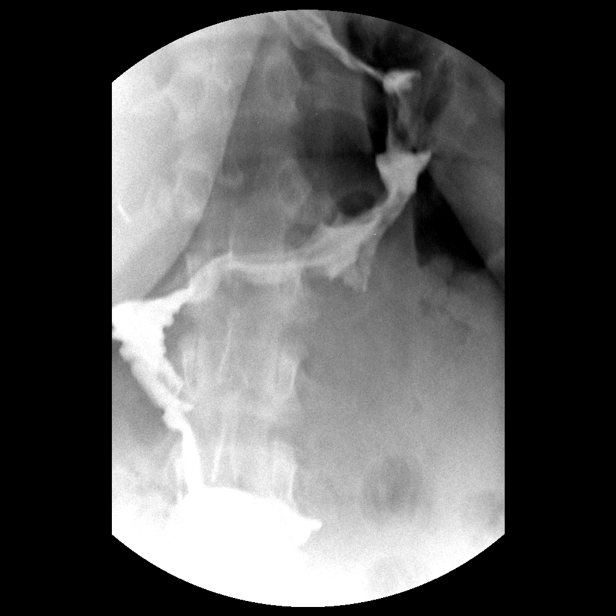
[im 33/35]
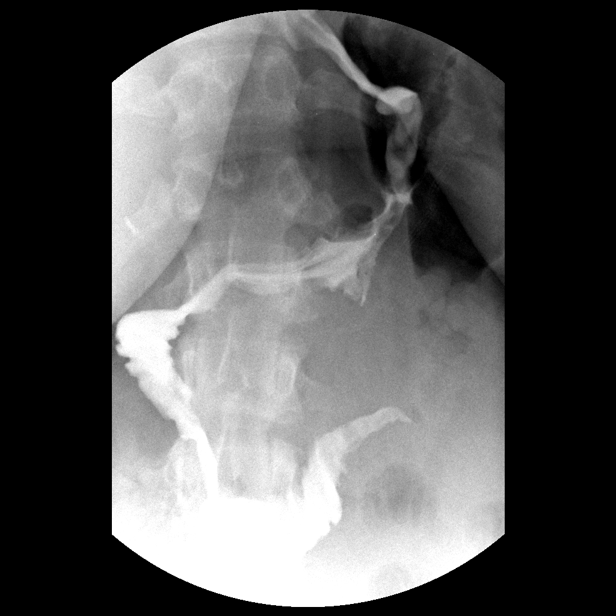

[Series 2: run · 1 of 1 slices shown (2 of 3)]
[im 1/1]
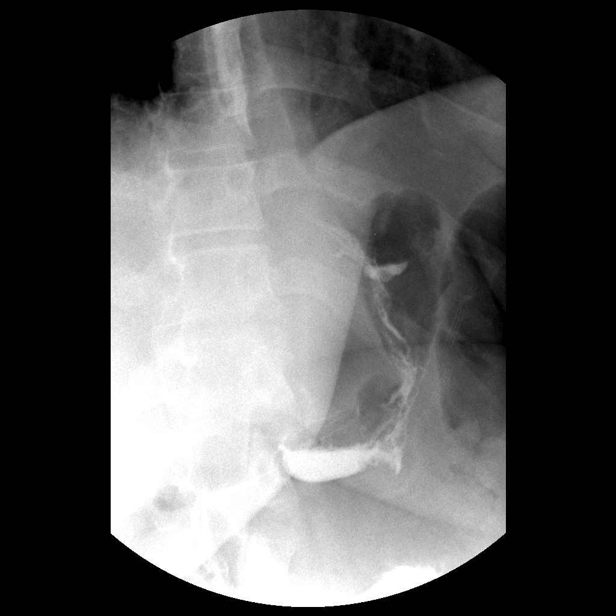

[Series 3: run · 1 of 1 slices shown (3 of 3)]
[im 1/1]
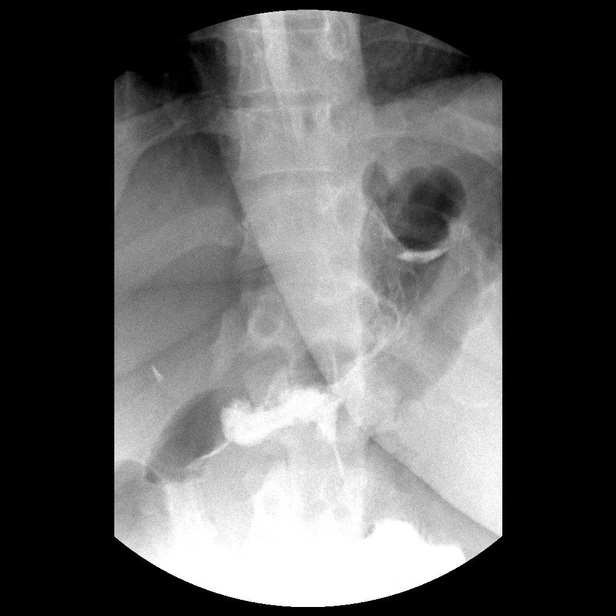

[Series 1001: view not recorded · 0.20mm/px · 1 of 1 slices shown]
[im 1/1]
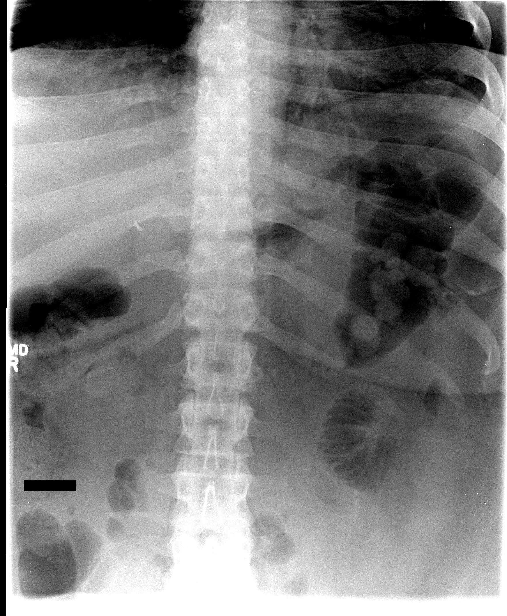

[15 of 24 positions shown; findings below may reference images not displayed]

FINDINGS: Distal esophagus is normal. Contrast enters the stomach without
delay. Satisfactory gastric sleeve appearance. No leak or
obstruction. Stomach empties readily into the duodenum. No duodenum
obstruction or leak is identified.
IMPRESSION: Satisfactory gastric sleeve procedure.  No obstruction or leak.

## 2017-04-06 ENCOUNTER — Ambulatory Visit: Payer: BLUE CROSS/BLUE SHIELD | Admitting: Registered"

## 2017-06-30 ENCOUNTER — Emergency Department (HOSPITAL_COMMUNITY)
Admission: EM | Admit: 2017-06-30 | Discharge: 2017-06-30 | Disposition: A | Discharge status: Home / Self Care | Payer: BC Managed Care – PPO | Attending: Emergency Medicine | Admitting: Emergency Medicine

## 2017-06-30 ENCOUNTER — Encounter (HOSPITAL_COMMUNITY): Payer: Self-pay | Admitting: Emergency Medicine

## 2017-06-30 DIAGNOSIS — I10 Essential (primary) hypertension: Secondary | ICD-10-CM | POA: Insufficient documentation

## 2017-06-30 DIAGNOSIS — M545 Low back pain: Secondary | ICD-10-CM | POA: Diagnosis present

## 2017-06-30 DIAGNOSIS — M5431 Sciatica, right side: Secondary | ICD-10-CM

## 2017-06-30 MED ORDER — CYCLOBENZAPRINE HCL 10 MG PO TABS
10.0000 mg | ORAL_TABLET | Freq: Once | ORAL | Status: AC
  Administered 2017-06-30: 10 mg via ORAL
  Filled 2017-06-30: qty 1

## 2017-06-30 MED ORDER — CYCLOBENZAPRINE HCL 10 MG PO TABS: 10.0000 mg | ORAL_TABLET | Freq: Two times a day (BID) | ORAL | 0 refills | Status: AC | PRN

## 2017-06-30 MED ORDER — DICLOFENAC SODIUM 50 MG PO TBEC: 50.0000 mg | DELAYED_RELEASE_TABLET | Freq: Two times a day (BID) | ORAL | 0 refills | Status: AC

## 2017-06-30 MED ORDER — HYDROCODONE-ACETAMINOPHEN 5-325 MG PO TABS
1.0000 | ORAL_TABLET | Freq: Once | ORAL | Status: AC
  Administered 2017-06-30: 1 via ORAL
  Filled 2017-06-30: qty 1

## 2017-06-30 NOTE — ED Provider Notes (Signed)
[facility]-EMERGENCY DEPT Provider Note   CSN: [id number] Arrival date & time: 06/30/17  1804     History   Chief Complaint Chief Complaint  Patient presents with  . Back Pain    HPI Kimberly Boone is a 31 y.o. female who presents to the ED with back pain. Patient reports that she work up with the pain in the lower back yesterday. Today the pain has increased and is radiating down the back of the left leg. Patient denies any injuries. Patient reports hx of back pain in 2015 and had an MRI but did not go to get the results but her back stopped hurting. Patient has taken tylenol without relief.   The history is provided by the patient.  Back Pain   This is a new problem. The current episode started yesterday. The problem occurs constantly. The problem has been gradually worsening. The pain is associated with no known injury. The pain is present in the lumbar spine. The quality of the pain is described as shooting. The pain radiates to the left thigh. The pain is at a severity of 8/10. The pain is the same all the time. Pertinent negatives include no chest pain, no fever, no headaches, no abdominal pain, no bowel incontinence, no bladder incontinence, no dysuria and no pelvic pain.    Past Medical History:  Diagnosis Date  . Hypertension     Patient Active Problem List   Diagnosis Date Noted  . 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 Morbid obesity with BMI of 50.0-59.9, adult (HCC) 07/09/2014  . History of laparoscopic cholecystectomy 03/21/2014  . Essential hypertension, benign 03/21/2014    Past Surgical History:  Procedure Laterality Date  . BREATH TEK H PYLORI N/A 04/09/2014   Procedure: BREATH TEK H PYLORI;  Surgeon: Valarie Merino, MD;  Location: [facility];  Service: General;  Laterality: N/A;  . CHOLECYSTECTOMY    . LAPAROSCOPIC GASTRIC SLEEVE RESECTION N/A 07/09/2014   Procedure: LAPAROSCOPIC GASTRIC SLEEVE RESECTION WITH UPPER ENDOSCOPY;  Surgeon:  Valarie Merino, MD;  Location: [facility];  Service: General;  Laterality: N/A;    OB History    No data available       Home Medications    Prior to Admission medications   Medication Sig Start Date End Date Taking? Authorizing Provider  BIOTIN PO Take 1 tablet by mouth every morning.    [provider]  CALCIUM-VITAMIN D PO Take 1 tablet by mouth every morning.    [provider]  carvedilol (COREG) 6.25 MG tablet Take 6.25 mg by mouth 2 (two) times daily with a meal.    [provider]  cyclobenzaprine (FLEXERIL) 10 MG tablet Take 1 tablet (10 mg total) by mouth 2 (two) times daily as needed for muscle spasms. 06/30/17   Janne Napoleon, NP  diclofenac (VOLTAREN) 50 MG EC tablet Take 1 tablet (50 mg total) by mouth 2 (two) times daily. 06/30/17   Janne Napoleon, NP  FOLIC ACID PO Take 1 tablet by mouth every morning.    [provider]  Multiple Vitamins-Minerals (HM MULTIVITAMIN ADULT GUMMY) CHEW Chew 1 tablet by mouth every morning.    [provider]    Family History Family History  Problem Relation Age of Onset  . Cancer Father     Social History Social History  Substance Use Topics  . Smoking status: Never Smoker  . Smokeless tobacco: Never Used  . Alcohol use Yes  Comment: occasional     Allergies   Patient has no known allergies.   Review of Systems Review of Systems  Constitutional: Negative for fever.  HENT: Negative.   Eyes: Negative for visual disturbance.  Respiratory: Negative for cough and shortness of breath.   Cardiovascular: Negative for chest pain.  Gastrointestinal: Negative for abdominal pain, bowel incontinence, nausea and vomiting.  Genitourinary: Negative for bladder incontinence, dysuria, frequency, pelvic pain and urgency.  Musculoskeletal: Positive for back pain.  Skin: Negative for rash and wound.  Neurological: Negative for light-headedness and headaches.  Psychiatric/Behavioral: Negative  for confusion. Nervous/anxious: controlled with medication.      Physical Exam Updated Vital Signs BP (!) 137/95 (BP Location: Right Arm)   Pulse 83   Temp 98.5 F (36.9 C) (Oral)   Resp 16   Ht  (1.676 m)   Wt 130.2 kg (287 lb)   LMP 06/22/2017 (Exact Date)   SpO2 100%   BMI 46.32 kg/m   Physical Exam  Constitutional: No distress.  Morbidly obese  HENT:  Head: Normocephalic and atraumatic.  Right Ear: Tympanic membrane normal.  Left Ear: Tympanic membrane normal.  Nose: Nose normal.  Mouth/Throat: Uvula is midline, oropharynx is clear and moist and mucous membranes are normal.  Eyes: EOM are normal.  Neck: Normal range of motion. Neck supple.  Cardiovascular: Normal rate and regular rhythm.   Pulmonary/Chest: Effort normal. She has no wheezes. She has no rales.  Abdominal: Soft. There is no tenderness.  Musculoskeletal:       Lumbar back: She exhibits tenderness, pain and spasm. She exhibits normal pulse. Decreased range of motion: due to pain.       Back:  Tenderness with palpation of the right lumbar area and over the right sciatic nerve.   Neurological: She is alert. She has normal strength. No sensory deficit. Gait normal.  Reflex Scores:      Bicep reflexes are 2+ on the right side and 2+ on the left side.      Brachioradialis reflexes are 2+ on the right side and 2+ on the left side.      Patellar reflexes are 2+ on the right side and 2+ on the left side. Skin: Skin is warm and dry.  Psychiatric: She has a normal mood and affect. Her behavior is normal.  Nursing note and vitals reviewed.    ED Treatments / Results  Labs (all labs ordered are listed, but only abnormal results are displayed) Labs Reviewed - No data to display  Radiology No results found.  Procedures Procedures (including critical care time)  Medications Ordered in ED Medications  cyclobenzaprine (FLEXERIL) tablet 10 mg (not administered)  HYDROcodone-acetaminophen  (NORCO/VICODIN) 5-325 MG per tablet 1 tablet (not administered)     Initial Impression / Assessment and Plan / ED Course  I have reviewed the triage vital signs and the nursing notes. Patient with back pain.  No neurological deficits and normal neuro exam.  Patient can walk but states is painful.  No loss of bowel or bladder control.  No concern for cauda equina. Tender over right lumbar and right sciatic nerve.  No fever, night sweats, weight loss, h/o cancer, IVDU.  RICE protocol and pain medicine indicated and discussed with patient.   Final Clinical Impressions(s) / ED Diagnoses   Final diagnoses:  Sciatica of right side    New Prescriptions New Prescriptions   CYCLOBENZAPRINE (FLEXERIL) 10 MG TABLET    Take 1 tablet (10 mg total) by  mouth 2 (two) times daily as needed for muscle spasms.   DICLOFENAC (VOLTAREN) 50 MG EC TABLET    Take 1 tablet (50 mg total) by mouth 2 (two) times daily.     Kerrie Buffalo [place], [place] 06/30/17 1936    Derwood Kaplan, MD 07/01/17 [phone]

## 2017-06-30 NOTE — ED Triage Notes (Signed)
Pt st's she woke up with pain in lower back yesterday.  St"s today pain is radiating down back of left leg.  Pt denies any injury

## 2017-06-30 NOTE — Discharge Instructions (Signed)
Do not take the muscle relaxant if driving as it will make you sleepy. Follow up with your primary care doctor to decide if you need to see an orthopedic doctor or go to physical therapy. Return here as needed.

## 2022-06-23 ENCOUNTER — Other Ambulatory Visit: Payer: Self-pay | Admitting: Surgery

## 2022-06-23 DIAGNOSIS — R12 Heartburn: Secondary | ICD-10-CM

## 2022-07-01 ENCOUNTER — Other Ambulatory Visit: Payer: Self-pay | Admitting: Surgery

## 2022-07-01 ENCOUNTER — Ambulatory Visit
Admission: RE | Admit: 2022-07-01 | Discharge: 2022-07-01 | Disposition: A | Discharge status: Home / Self Care | Payer: BC Managed Care – PPO | Source: Ambulatory Visit | Attending: Surgery | Admitting: Surgery

## 2022-07-01 DIAGNOSIS — R12 Heartburn: Secondary | ICD-10-CM

## 2022-08-23 ENCOUNTER — Other Ambulatory Visit: Payer: Self-pay

## 2022-08-23 DIAGNOSIS — K2100 Gastro-esophageal reflux disease with esophagitis, without bleeding: Secondary | ICD-10-CM | POA: Insufficient documentation

## 2022-09-09 ENCOUNTER — Encounter (INDEPENDENT_AMBULATORY_CARE_PROVIDER_SITE_OTHER): Payer: Self-pay

## 2022-09-27 ENCOUNTER — Telehealth: Payer: BC Managed Care – PPO

## 2022-10-01 ENCOUNTER — Encounter (INDEPENDENT_AMBULATORY_CARE_PROVIDER_SITE_OTHER): Payer: Self-pay

## 2022-10-01 ENCOUNTER — Ambulatory Visit (INDEPENDENT_AMBULATORY_CARE_PROVIDER_SITE_OTHER): Payer: BC Managed Care – PPO

## 2022-10-01 NOTE — PSS Phone Screening (Signed)
Surgical Risk Level : (Low, Intermediate, High)  Bariatric- Intermediate per Anesthesia     Surgeon Testing Requirements/on chart already-media   []  Medical Clearance   []  Labs/CBC,CMP,Irons/Vits   []  EKG  []  Psych Consult          Anesthesia Guideline Requirements:      Routine Bariatric testing -A1C(age >34 and BMI>34)-Epic/Media      Faxes sent/Specialist Notes / Test Results / Records Requested:          Preops-Efaxed surgeon office to send  preops          Future Plan / Upcoming Appts/Labs or Other Testing                E-mail Sent To Patient: Patient verbally consented to have post interview email sent to email address below:  [email or web address]   Emailed information included:    PSS Points of Contact: [email or web address] or phone [phone] to patient/family member                NPO Instructions per Preoperative Fasting Guidelines for Elective Surgeries and Procedures Requiring Anesthesia Policy    Preparing for surgery brochure                Pre Procedural Skin Prep CHG Instructions               Bariatric Education Bundle Information     Pre-Anesthesia Evaluation    Pre-op phone visit requested by: Fitzer, Sheral Apley, MD  Reason for pre-op phone visit: Patient anticipating LAPAROSCOPIC, GASTRIC BYPASS, CONVERSION FROM SLEEVE GASTRECTOMY, EGD procedure.    Language Assistant  Interpreter: N/A - English is preferred language    No orders of the defined types were placed in this encounter.      History of Present Illness/Summary:        Problem List:  Medical Problems       Non-Hospital Problem List  Never Reviewed            ICD-10-CM Priority Class Noted    Morbid obesity E66.01   08/23/2022    Gastroesophageal reflux disease with esophagitis without hemorrhage K21.00   08/23/2022        Medical History   Diagnosis Date    Hypertension     Post-operative nausea and vomiting      Past Surgical History:   Procedure Laterality Date    ABDOMINAL SURGERY      Gastric sleeve 2015    CHOLECYSTECTOMY      2010         Medication List            Accurate as of October 01, 2022 11:38 AM. Always use your most recent med list.                Lipitor 10 MG tablet  Take 1 tablet (10 mg) by mouth every morning  Generic drug: atorvastatin  Medication Adjustments for Surgery: Take morning of surgery     losartan 50 MG tablet  Take 1 tablet (50 mg) by mouth every morning  Commonly known as: COZAAR  Medication Adjustments for Surgery: Hold day of surgery     MULTIPLE VITAMIN PO  Take 1 tablet by mouth daily  Medication Adjustments for Surgery: Hold day of surgery            Allergies   Allergen Reactions    Nsaids Shortness Of Breath     History reviewed. No pertinent family history.  Social History  Occupational History    Not on file   Tobacco Use    Smoking status: Never    Smokeless tobacco: Never   Vaping Use    Vaping Use: Never used   Substance and Sexual Activity    Alcohol use: Never    Drug use: Never    Sexual activity: Not on file       Menstrual History:   LMP / Status  Having periods     Patient's last menstrual period was 09/25/2022.    Tubal Ligation?  No valid surgical or medical questions entered.           Exam Scores:   SDB score  Risk Category: No Risk    STBUR score       PONV score  Nausea Risk: VERY SEVERE RISK    MST score  MST Score: 0    PEN-FAST score       Frailty score            Visit Vitals  Ht 1.676 m (5\' 6" )   Wt 145.2 kg (320 lb)   LMP 09/25/2022   BMI 51.65 kg/m

## 2022-10-13 NOTE — PSS Phone Screening (Signed)
E-Faxed request to surgeon's office for Medical Clearance/Labs/EKG/ H&P and Psych Eval .

## 2022-10-17 ENCOUNTER — Encounter (INDEPENDENT_AMBULATORY_CARE_PROVIDER_SITE_OTHER): Payer: Self-pay

## 2022-10-19 NOTE — Anesthesia Preprocedure Evaluation (Signed)
Relevant Problems   GI   (+) Gastroesophageal reflux disease with esophagitis without hemorrhage       PSS Anesthesia Comments: Does patient have CMP?  BMI-51.65 with history of PONV,HTN: Labs-WNL: EKG- SR, borderline prolonged YM:EBRAXEN clearance-Dr. Karlton Lemon.LS      Pre-evaluation Note Incomplete - DO NOT USE FOR CLINICAL DECISIONS    Anesthesia Plan                                               Signed by: Ermalinda Memos, NP 10/19/22 2:56 PM

## 2022-10-20 ENCOUNTER — Inpatient Hospital Stay: Payer: BC Managed Care – PPO | Admitting: Acute Care

## 2022-10-20 ENCOUNTER — Ambulatory Visit: Payer: Self-pay

## 2022-10-20 ENCOUNTER — Inpatient Hospital Stay
Admission: RE | Admit: 2022-10-20 | Discharge: 2022-10-21 | DRG: 620 | Disposition: A | Discharge status: Home / Self Care | Payer: BC Managed Care – PPO | Attending: Surgery | Admitting: Surgery

## 2022-10-20 ENCOUNTER — Encounter
Admission: RE | Disposition: A | Discharge status: Home / Self Care | Payer: Self-pay | Source: Home / Self Care | Attending: Surgery

## 2022-10-20 ENCOUNTER — Encounter: Payer: Self-pay | Admitting: Surgery

## 2022-10-20 DIAGNOSIS — Z9884 Bariatric surgery status: Secondary | ICD-10-CM

## 2022-10-20 DIAGNOSIS — K2100 Gastro-esophageal reflux disease with esophagitis, without bleeding: Secondary | ICD-10-CM | POA: Diagnosis present

## 2022-10-20 DIAGNOSIS — E785 Hyperlipidemia, unspecified: Secondary | ICD-10-CM | POA: Diagnosis present

## 2022-10-20 DIAGNOSIS — K311 Adult hypertrophic pyloric stenosis: Secondary | ICD-10-CM | POA: Diagnosis present

## 2022-10-20 DIAGNOSIS — I10 Essential (primary) hypertension: Secondary | ICD-10-CM | POA: Diagnosis present

## 2022-10-20 DIAGNOSIS — Z6841 Body Mass Index (BMI) 40.0 and over, adult: Secondary | ICD-10-CM

## 2022-10-20 HISTORY — PX: EGD: SHX3789

## 2022-10-20 HISTORY — DX: Gastro-esophageal reflux disease without esophagitis: K21.9

## 2022-10-20 HISTORY — PX: LAPAROSCOPIC, SMALL BOWEL RESECTION: SHX4568

## 2022-10-20 HISTORY — DX: Hyperlipidemia, unspecified: E78.5

## 2022-10-20 HISTORY — PX: LAPAROSCOPIC, GASTRIC BYPASS, CONVERSION FROM SLEEVE GASTRECTOMY: SHX9539

## 2022-10-20 HISTORY — PX: LAPAROSCOPIC, SUBTOTAL GASTRECTOMY: SHX9544

## 2022-10-20 LAB — URINE HCG QUALITATIVE: Urine HCG Qualitative: NEGATIVE

## 2022-10-20 SURGERY — LAPAROSCOPIC, GASTRIC BYPASS, CONVERSION FROM SLEEVE GASTRECTOMY
Anesthesia: Anesthesia General | Site: Esophagus | Wound class: Clean Contaminated

## 2022-10-20 MED ORDER — CEFAZOLIN SODIUM 1 G IJ SOLR
INTRAMUSCULAR | Status: AC
Start: 2022-10-20 — End: ?
  Filled 2022-10-20: qty 2000

## 2022-10-20 MED ORDER — PROMETHAZINE HCL 25 MG/ML IJ SOLN
INTRAMUSCULAR | Status: DC | PRN
Start: 2022-10-20 — End: 2022-10-20
  Administered 2022-10-20: 6.25 mg via INTRAVENOUS

## 2022-10-20 MED ORDER — BUPIVACAINE HCL (PF) 0.25 % IJ SOLN
INTRAMUSCULAR | Status: DC | PRN
Start: 2022-10-20 — End: 2022-10-20
  Administered 2022-10-20: 60 mL

## 2022-10-20 MED ORDER — ROCURONIUM BROMIDE 50 MG/5ML IV SOLN
INTRAVENOUS | Status: AC
Start: 2022-10-20 — End: ?
  Filled 2022-10-20: qty 5

## 2022-10-20 MED ORDER — SCOPOLAMINE 1 MG/3DAYS TD PT72
MEDICATED_PATCH | TRANSDERMAL | Status: AC
Start: 2022-10-20 — End: ?
  Filled 2022-10-20: qty 1

## 2022-10-20 MED ORDER — PROPOFOL 10 MG/ML IV EMUL (WRAP)
INTRAVENOUS | Status: AC
Start: 2022-10-20 — End: ?
  Filled 2022-10-20: qty 20

## 2022-10-20 MED ORDER — OXYCODONE HCL 5 MG/5ML PO SOLN
5.0000 mg | ORAL | Status: DC | PRN
Start: 2022-10-20 — End: 2022-10-21
  Administered 2022-10-21: 5 mg via ORAL
  Filled 2022-10-20 (×2): qty 5

## 2022-10-20 MED ORDER — LIDOCAINE HCL 1 % IJ SOLN
1.0000 mL | Freq: Once | INTRAMUSCULAR | Status: DC | PRN
Start: 2022-10-20 — End: 2022-10-20

## 2022-10-20 MED ORDER — KCL IN DEXTROSE-NACL 20-5-0.45 MEQ/L-%-% IV SOLN
INTRAVENOUS | Status: DC
Start: 2022-10-20 — End: 2022-10-21

## 2022-10-20 MED ORDER — FENTANYL CITRATE (PF) 50 MCG/ML IJ SOLN (WRAP)
INTRAMUSCULAR | Status: DC | PRN
Start: 2022-10-20 — End: 2022-10-20
  Administered 2022-10-20: 100 ug via INTRAVENOUS

## 2022-10-20 MED ORDER — KETAMINE HCL 50 MG/ML IJ SOLN
INTRAMUSCULAR | Status: DC | PRN
Start: 2022-10-20 — End: 2022-10-20
  Administered 2022-10-20 (×5): 10 mg via INTRAVENOUS

## 2022-10-20 MED ORDER — ONDANSETRON HCL 4 MG/2ML IJ SOLN
INTRAMUSCULAR | Status: AC
Start: 2022-10-20 — End: ?
  Filled 2022-10-20: qty 2

## 2022-10-20 MED ORDER — FENTANYL CITRATE (PF) 50 MCG/ML IJ SOLN (WRAP)
INTRAMUSCULAR | Status: AC
Start: 2022-10-20 — End: ?
  Filled 2022-10-20: qty 2

## 2022-10-20 MED ORDER — ONDANSETRON HCL 4 MG/2ML IJ SOLN
INTRAMUSCULAR | Status: DC | PRN
Start: 2022-10-20 — End: 2022-10-20
  Administered 2022-10-20: 4 mg via INTRAVENOUS

## 2022-10-20 MED ORDER — SUCRALFATE 1 GM/10ML PO SUSP
1.0000 g | Freq: Four times a day (QID) | ORAL | Status: DC
Start: 2022-10-20 — End: 2022-10-21
  Administered 2022-10-20 – 2022-10-21 (×4): 1 g via ORAL
  Filled 2022-10-20 (×4): qty 10

## 2022-10-20 MED ORDER — HYDROMORPHONE HCL 0.5 MG/0.5 ML IJ SOLN
0.5000 mg | INTRAMUSCULAR | Status: DC | PRN
Start: 2022-10-20 — End: 2022-10-20

## 2022-10-20 MED ORDER — ACETAMINOPHEN 500 MG PO TABS
500.0000 mg | ORAL_TABLET | Freq: Once | ORAL | Status: DC | PRN
Start: 2022-10-20 — End: 2022-10-20

## 2022-10-20 MED ORDER — PHENYLEPHRINE 100 MCG/ML IV SOSY (WRAP)
PREFILLED_SYRINGE | INTRAVENOUS | Status: AC
Start: 2022-10-20 — End: ?
  Filled 2022-10-20: qty 10

## 2022-10-20 MED ORDER — SODIUM CHLORIDE 0.9 % IV SOLN
INTRAVENOUS | Status: AC | PRN
Start: 2022-10-20 — End: 2022-10-20
  Administered 2022-10-20: 3000 mL

## 2022-10-20 MED ORDER — LACTATED RINGERS IV SOLN
INTRAVENOUS | Status: DC
Start: 2022-10-20 — End: 2022-10-21

## 2022-10-20 MED ORDER — MIDAZOLAM HCL 1 MG/ML IJ SOLN (WRAP)
INTRAMUSCULAR | Status: DC | PRN
Start: 2022-10-20 — End: 2022-10-20
  Administered 2022-10-20: 2 mg via INTRAVENOUS

## 2022-10-20 MED ORDER — PROCHLORPERAZINE EDISYLATE 10 MG/2ML IJ SOLN
5.0000 mg | Freq: Four times a day (QID) | INTRAMUSCULAR | Status: DC | PRN
Start: 2022-10-20 — End: 2022-10-21
  Administered 2022-10-21: 5 mg via INTRAVENOUS
  Filled 2022-10-20: qty 2

## 2022-10-20 MED ORDER — HYOSCYAMINE SULFATE 0.125 MG SL SUBL
0.1250 mg | SUBLINGUAL_TABLET | Freq: Four times a day (QID) | SUBLINGUAL | Status: DC | PRN
Start: 2022-10-20 — End: 2022-10-21
  Administered 2022-10-21: 0.125 mg via SUBLINGUAL
  Filled 2022-10-20: qty 1

## 2022-10-20 MED ORDER — PANTOPRAZOLE SODIUM 40 MG PO TBEC
40.0000 mg | DELAYED_RELEASE_TABLET | Freq: Every morning | ORAL | Status: DC
Start: 2022-10-20 — End: 2022-10-21
  Administered 2022-10-21: 40 mg via ORAL
  Filled 2022-10-20: qty 1

## 2022-10-20 MED ORDER — METOPROLOL TARTRATE 25 MG PO TABS
25.0000 mg | ORAL_TABLET | Freq: Two times a day (BID) | ORAL | Status: DC
Start: 2022-10-20 — End: 2022-10-21
  Administered 2022-10-20 – 2022-10-21 (×2): 25 mg via ORAL
  Filled 2022-10-20 (×2): qty 1

## 2022-10-20 MED ORDER — HEPARIN SODIUM (PORCINE) 5000 UNIT/ML IJ SOLN
INTRAMUSCULAR | Status: AC
Start: 2022-10-20 — End: ?
  Filled 2022-10-20: qty 1

## 2022-10-20 MED ORDER — SUGAMMADEX SODIUM 200 MG/2ML IV SOLN
INTRAVENOUS | Status: AC
Start: 2022-10-20 — End: ?
  Filled 2022-10-20: qty 2

## 2022-10-20 MED ORDER — ACETAMINOPHEN 10 MG/ML IV SOLN
INTRAVENOUS | Status: DC | PRN
Start: 2022-10-20 — End: 2022-10-20
  Administered 2022-10-20: 1000 mg via INTRAVENOUS

## 2022-10-20 MED ORDER — OXYCODONE HCL 5 MG PO TABS
5.0000 mg | ORAL_TABLET | Freq: Once | ORAL | Status: DC | PRN
Start: 2022-10-20 — End: 2022-10-20

## 2022-10-20 MED ORDER — SUCCINYLCHOLINE CHLORIDE 20 MG/ML IJ SOLN
INTRAMUSCULAR | Status: DC | PRN
Start: 2022-10-20 — End: 2022-10-20
  Administered 2022-10-20: 200 mg via INTRAVENOUS

## 2022-10-20 MED ORDER — BUPIVACAINE HCL (PF) 0.25 % IJ SOLN
INTRAMUSCULAR | Status: AC
Start: 2022-10-20 — End: ?
  Filled 2022-10-20: qty 60

## 2022-10-20 MED ORDER — SUGAMMADEX SODIUM 200 MG/2ML IV SOLN
INTRAVENOUS | Status: DC | PRN
Start: 2022-10-20 — End: 2022-10-20
  Administered 2022-10-20: 300 mg via INTRAVENOUS

## 2022-10-20 MED ORDER — DEXTROSE 10 % IV BOLUS
12.5000 g | INTRAVENOUS | Status: DC | PRN
Start: 2022-10-20 — End: 2022-10-21

## 2022-10-20 MED ORDER — GLUCOSE 40 % PO GEL (WRAP)
15.0000 g | ORAL | Status: DC | PRN
Start: 2022-10-20 — End: 2022-10-21

## 2022-10-20 MED ORDER — DEXTROSE 50 % IV SOLN
12.5000 g | INTRAVENOUS | Status: DC | PRN
Start: 2022-10-20 — End: 2022-10-21

## 2022-10-20 MED ORDER — HYDROMORPHONE HCL 0.5 MG/0.5 ML IJ SOLN
0.5000 mg | INTRAMUSCULAR | Status: DC | PRN
Start: 2022-10-20 — End: 2022-10-21
  Administered 2022-10-20 – 2022-10-21 (×4): 0.5 mg via INTRAVENOUS
  Filled 2022-10-20 (×4): qty 0.5

## 2022-10-20 MED ORDER — PIPERACILLIN-TAZOBACTAM 4.5 GM MBP (CNR)
4.5000 g | Freq: Once | INTRAVENOUS | Status: DC
Start: 2022-10-20 — End: 2022-10-21
  Filled 2022-10-20: qty 4500

## 2022-10-20 MED ORDER — PIPERACILLIN-TAZOBACTAM 4.5 GM MBP (CNR)
4.5000 g | Freq: Three times a day (TID) | INTRAVENOUS | Status: DC
Start: 2022-10-20 — End: 2022-10-21
  Administered 2022-10-20 – 2022-10-21 (×4): 4500 mg via INTRAVENOUS
  Filled 2022-10-20 (×7): qty 4500

## 2022-10-20 MED ORDER — SUCCINYLCHOLINE CHLORIDE 20 MG/ML IJ SOLN
INTRAMUSCULAR | Status: AC
Start: 2022-10-20 — End: ?
  Filled 2022-10-20: qty 10

## 2022-10-20 MED ORDER — EPHEDRINE SULFATE 50 MG/ML IJ/IV SOLN (WRAP)
Status: DC | PRN
Start: 2022-10-20 — End: 2022-10-20
  Administered 2022-10-20: 5 mg via INTRAVENOUS

## 2022-10-20 MED ORDER — MIDAZOLAM HCL 1 MG/ML IJ SOLN (WRAP)
INTRAMUSCULAR | Status: AC
Start: 2022-10-20 — End: ?
  Filled 2022-10-20: qty 2

## 2022-10-20 MED ORDER — ONDANSETRON HCL 4 MG/2ML IJ SOLN
4.0000 mg | Freq: Three times a day (TID) | INTRAMUSCULAR | Status: DC | PRN
Start: 2022-10-20 — End: 2022-10-21
  Administered 2022-10-20: 4 mg via INTRAVENOUS
  Filled 2022-10-20: qty 2

## 2022-10-20 MED ORDER — SODIUM CHLORIDE 0.9% BAG (IRRIGATION USE)
INTRAVENOUS | Status: DC | PRN
Start: 2022-10-20 — End: 2022-10-20
  Administered 2022-10-20: 1000 mL

## 2022-10-20 MED ORDER — KETAMINE HCL 50 MG/ML IJ/IV SOLN (WRAP)
Status: AC
Start: 2022-10-20 — End: ?
  Filled 2022-10-20: qty 1

## 2022-10-20 MED ORDER — ALBUTEROL SULFATE 1.25 MG/3ML IN NEBU
1.2500 mg | INHALATION_SOLUTION | RESPIRATORY_TRACT | Status: DC | PRN
Start: 2022-10-20 — End: 2022-10-21

## 2022-10-20 MED ORDER — PHENYLEPHRINE 100 MCG/ML IV BOLUS (ANESTHESIA)
PREFILLED_SYRINGE | INTRAVENOUS | Status: DC | PRN
Start: 2022-10-20 — End: 2022-10-20
  Administered 2022-10-20: 50 ug via INTRAVENOUS

## 2022-10-20 MED ORDER — SCOPOLAMINE 1 MG/3DAYS TD PT72
1.0000 | MEDICATED_PATCH | Freq: Once | TRANSDERMAL | Status: DC
Start: 2022-10-20 — End: 2022-10-20
  Administered 2022-10-20: 1 via TRANSDERMAL

## 2022-10-20 MED ORDER — LIDOCAINE 4 % EX CREA
TOPICAL_CREAM | Freq: Once | CUTANEOUS | Status: DC | PRN
Start: 2022-10-20 — End: 2022-10-20

## 2022-10-20 MED ORDER — HYDRALAZINE HCL 20 MG/ML IJ SOLN
10.0000 mg | Freq: Four times a day (QID) | INTRAMUSCULAR | Status: DC
Start: 2022-10-20 — End: 2022-10-21
  Administered 2022-10-21: 10 mg via INTRAVENOUS
  Filled 2022-10-20: qty 1

## 2022-10-20 MED ORDER — SODIUM CHLORIDE 0.9 % IV SOLN
INTRAVENOUS | Status: DC | PRN
Start: 2022-10-20 — End: 2022-10-20
  Administered 2022-10-20: .5 ug/kg/h via INTRAVENOUS

## 2022-10-20 MED ORDER — LACTATED RINGERS IV BOLUS
1000.0000 mL | INTRAVENOUS | Status: AC
Start: 2022-10-20 — End: 2022-10-20
  Administered 2022-10-20: 1000 mL via INTRAVENOUS

## 2022-10-20 MED ORDER — ACETAMINOPHEN 10 MG/ML IV SOLN
1000.0000 mg | Freq: Four times a day (QID) | INTRAVENOUS | Status: AC
Start: 2022-10-20 — End: 2022-10-21
  Administered 2022-10-20 – 2022-10-21 (×3): 1000 mg via INTRAVENOUS
  Filled 2022-10-20 (×4): qty 100

## 2022-10-20 MED ORDER — HEPARIN SODIUM (PORCINE) 5000 UNIT/ML IJ SOLN
5000.0000 [IU] | INTRAMUSCULAR | Status: AC
Start: 2022-10-20 — End: 2022-10-20
  Administered 2022-10-20: 5000 [IU] via SUBCUTANEOUS

## 2022-10-20 MED ORDER — FENTANYL CITRATE (PF) 50 MCG/ML IJ SOLN (WRAP)
25.0000 ug | INTRAMUSCULAR | Status: AC | PRN
Start: 2022-10-20 — End: 2022-10-20
  Administered 2022-10-20 (×4): 25 ug via INTRAVENOUS
  Filled 2022-10-20: qty 2

## 2022-10-20 MED ORDER — LIDOCAINE HCL 2 % IJ SOLN
INTRAMUSCULAR | Status: DC | PRN
Start: 2022-10-20 — End: 2022-10-20
  Administered 2022-10-20: 100 mg

## 2022-10-20 MED ORDER — CEFAZOLIN SODIUM 1 G IJ SOLR
INTRAMUSCULAR | Status: DC | PRN
Start: 2022-10-20 — End: 2022-10-20
  Administered 2022-10-20: 1 g via INTRAVENOUS

## 2022-10-20 MED ORDER — PANTOPRAZOLE SODIUM 40 MG IV SOLR
40.0000 mg | INTRAVENOUS | Status: AC
Start: 2022-10-20 — End: 2022-10-20
  Administered 2022-10-20: 40 mg via INTRAVENOUS

## 2022-10-20 MED ORDER — PROPOFOL 10 MG/ML IV EMUL (WRAP)
INTRAVENOUS | Status: DC | PRN
Start: 2022-10-20 — End: 2022-10-20
  Administered 2022-10-20: 270 mg via INTRAVENOUS

## 2022-10-20 MED ORDER — GLUCAGON 1 MG IJ SOLR (WRAP)
1.0000 mg | INTRAMUSCULAR | Status: DC | PRN
Start: 2022-10-20 — End: 2022-10-21

## 2022-10-20 MED ORDER — ONDANSETRON HCL 4 MG/2ML IJ SOLN
4.0000 mg | Freq: Once | INTRAMUSCULAR | Status: AC | PRN
Start: 2022-10-20 — End: 2022-10-20
  Administered 2022-10-20: 4 mg via INTRAVENOUS
  Filled 2022-10-20: qty 2

## 2022-10-20 MED ORDER — DEXMEDETOMIDINE HCL IN NACL 200 MCG/50ML IV SOLN
INTRAVENOUS | Status: AC
Start: 2022-10-20 — End: ?
  Filled 2022-10-20: qty 50

## 2022-10-20 MED ORDER — MAGNESIUM SULFATE 50 % IJ SOLN
INTRAMUSCULAR | Status: DC | PRN
Start: 2022-10-20 — End: 2022-10-20
  Administered 2022-10-20: 1 g via INTRAVENOUS

## 2022-10-20 MED ORDER — GLYCOPYRROLATE 0.2 MG/ML IJ SOLN (WRAP)
INTRAMUSCULAR | Status: DC | PRN
Start: 2022-10-20 — End: 2022-10-20
  Administered 2022-10-20: .2 mg via INTRAVENOUS

## 2022-10-20 MED ORDER — OXYCODONE HCL 5 MG/5ML PO SOLN
10.0000 mg | ORAL | Status: DC | PRN
Start: 2022-10-20 — End: 2022-10-21
  Administered 2022-10-21: 10 mg via ORAL
  Filled 2022-10-20: qty 10

## 2022-10-20 MED ORDER — ROCURONIUM BROMIDE 10 MG/ML IV SOLN (WRAP)
INTRAVENOUS | Status: DC | PRN
Start: 2022-10-20 — End: 2022-10-20
  Administered 2022-10-20: 45 mg via INTRAVENOUS
  Administered 2022-10-20: 5 mg via INTRAVENOUS
  Administered 2022-10-20: 10 mg via INTRAVENOUS
  Administered 2022-10-20: 15 mg via INTRAVENOUS
  Administered 2022-10-20: 10 mg via INTRAVENOUS
  Administered 2022-10-20: 5 mg via INTRAVENOUS
  Administered 2022-10-20 (×2): 10 mg via INTRAVENOUS
  Administered 2022-10-20: 5 mg via INTRAVENOUS

## 2022-10-20 MED ORDER — PANTOPRAZOLE SODIUM 40 MG IV SOLR
INTRAVENOUS | Status: AC
Start: 2022-10-20 — End: ?
  Filled 2022-10-20: qty 40

## 2022-10-20 MED ORDER — ACETAMINOPHEN 10 MG/ML IV SOLN
INTRAVENOUS | Status: AC
Start: 2022-10-20 — End: ?
  Filled 2022-10-20: qty 100

## 2022-10-20 MED ORDER — CEFAZOLIN SODIUM 1 G IJ SOLR
INTRAMUSCULAR | Status: AC
Start: 2022-10-20 — End: ?
  Filled 2022-10-20: qty 1000

## 2022-10-20 MED ORDER — SIMETHICONE 80 MG PO CHEW
80.0000 mg | CHEWABLE_TABLET | Freq: Four times a day (QID) | ORAL | Status: DC | PRN
Start: 2022-10-21 — End: 2022-10-21
  Administered 2022-10-21: 80 mg via ORAL
  Filled 2022-10-20: qty 1

## 2022-10-20 MED ORDER — LOSARTAN POTASSIUM 25 MG PO TABS
50.0000 mg | ORAL_TABLET | Freq: Every morning | ORAL | Status: DC
Start: 2022-10-20 — End: 2022-10-21
  Administered 2022-10-21: 50 mg via ORAL
  Filled 2022-10-20: qty 2

## 2022-10-20 MED ORDER — STERILE WATER FOR INJECTION IJ/IV SOLN (WRAP)
2.0000 g | INTRAMUSCULAR | Status: AC
Start: 2022-10-20 — End: 2022-10-20
  Administered 2022-10-20: 2 g via INTRAVENOUS

## 2022-10-20 MED ORDER — HEPARIN SODIUM (PORCINE) 5000 UNIT/ML IJ SOLN
5000.0000 [IU] | Freq: Three times a day (TID) | INTRAMUSCULAR | Status: DC
Start: 2022-10-21 — End: 2022-10-21
  Administered 2022-10-21 (×2): 5000 [IU] via SUBCUTANEOUS
  Filled 2022-10-20 (×2): qty 1

## 2022-10-20 MED ORDER — DIPHENHYDRAMINE HCL 50 MG/ML IJ SOLN
12.5000 mg | Freq: Four times a day (QID) | INTRAMUSCULAR | Status: DC | PRN
Start: 2022-10-20 — End: 2022-10-21

## 2022-10-20 MED ORDER — SUCCINYLCHOLINE CHLORIDE 20 MG/ML IJ SOLN
INTRAMUSCULAR | Status: AC
Start: 2022-10-20 — End: ?
  Filled 2022-10-20: qty 5

## 2022-10-20 MED ORDER — ACETAMINOPHEN 160 MG/5ML PO SUSP/SOLN (WRAP)
650.0000 mg | ORAL | Status: DC | PRN
Start: 2022-10-21 — End: 2022-10-21
  Administered 2022-10-21: 650 mg via ORAL
  Filled 2022-10-20 (×2): qty 20.3

## 2022-10-20 SURGICAL SUPPLY — 130 items
12mm diameter Port 150 mm length IMPLANT
ADHESIVE SKIN CLOSURE DERMABOND ADVANCED (Skin Closure) ×10 IMPLANT
ADHESIVE SKIN CLOSURE DERMABOND ADVANCED .7 ML LIQUID APPLICATOR (Skin Closure) ×10 IMPLANT
ADHESIVE SKNCLS 2 OCTYL CYNCRLT .7ML (Skin Closure) ×10
APPLICATOR CHLORAPREP 26 ML 70% ISOPROPYL ALCOHOL 2% CHLORHEXIDINE (Applicator) ×2 IMPLANT
APPLICATOR PRP 70% ISPRP 2% CHG 26ML (Applicator) ×2 IMPLANT
APPLIER IN CLP TI LG LGCLP 12MM 13.4IN (Staplers) ×2
APPLIER INTERNAL CLIP LARGE L13.4 IN (Staplers) ×2 IMPLANT
APPLIER INTERNAL CLIP LARGE L13.4 IN TITANIUM ROTATE MULTIPLE 20 CLIP (Staplers) ×2 IMPLANT
COVER FLEXIBLE LIGHT HANDLE PLASTIC GREEN (Procedure Accessories) ×2 IMPLANT
COVER FLEXIBLE MEDLINE LIGHT HANDLE (Procedure Accessories) ×2 IMPLANT
COVER LGHT HNDL PLS LF STRL FLXB DISP (Procedure Accessories) ×2
DISSECTOR LAPAROSCOPIC L39 CM CURVE JAW (Procedure Accessories) ×2 IMPLANT
DISSECTOR LAPAROSCOPIC L39 CM CURVE JAW ULTRASONIC CORDLESS SONICISION (Procedure Accessories) ×2 IMPLANT
DISSECTOR LAPAROSCOPIC L48 CM CURVE JAW (Procedure Accessories) IMPLANT
DISSECTOR LAPAROSCOPIC L48 CM CURVE JAW CORDLESS ULTRASONIC SONICISION (Procedure Accessories) IMPLANT
DISSECTOR LAPSCP SONICISION 39CM CRV JAW (Procedure Accessories) ×2
DISSECTOR LAPSCP SONICISION 48CM CRV JAW (Procedure Accessories)
DRAPE SRG SMS .75 PRXM 77X53IN LF STRL (Drape) ×2
DRAPE SURGICAL SHEET L77 IN X W53 IN (Drape) ×2 IMPLANT
DRAPE SURGICAL SHEET L77 IN X W53 IN MEDLINE SMS 3/4 BLUE (Drape) ×2 IMPLANT
GLOVE SRG PLISPRN 6 BGL PI LF STRL PF (Glove)
GLOVE SRG PLISPRN 6.5 BGL PI INDCTR (Glove) ×2
GLOVE SRG PLISPRN 6.5 BGL PI LF STRL PF (Glove)
GLOVE SRG PLISPRN 7 BGL PI LF STRL PF (Glove)
GLOVE SRG PLISPRN 8 BGL PI LF STRL PF (Glove) ×6
GLOVE SURGICAL 6 1/2 BIOGEL PI INDICATOR (Glove) ×2 IMPLANT
GLOVE SURGICAL 6 1/2 BIOGEL PI INDICATOR UNDERGLOVE POWDER FREE SMOOTH (Glove) ×2 IMPLANT
GLOVE SURGICAL 6 BIOGEL PI POWDER FREE (Glove) IMPLANT
GLOVE SURGICAL 6 BIOGEL PI POWDER FREE ROUGH BEAD CUFF NONPYROGENIC (Glove) IMPLANT
GLOVE SURGICAL 6.5 BIOGEL PI POWDER FREE (Glove) IMPLANT
GLOVE SURGICAL 6.5 BIOGEL PI POWDER FREE BEAD CUFF MICRO ROUGHENED NON (Glove) IMPLANT
GLOVE SURGICAL 7 BIOGEL PI POWDER FREE (Glove) IMPLANT
GLOVE SURGICAL 7 BIOGEL PI POWDER FREE MICRO ROUGHEN BEAD CUFF (Glove) IMPLANT
GLOVE SURGICAL 8 BIOGEL PI POWDER FREE (Glove) ×6 IMPLANT
GLOVE SURGICAL 8 BIOGEL PI POWDER FREE MICRO ROUGHENED BEAD CUFF (Glove) ×6 IMPLANT
GOWN SRG SMS PE LG SIRUS 43IN LF STRL (Gown) ×2
GOWN SURGICAL LARGE L43 IN SMS (Gown) ×2 IMPLANT
GOWN SURGICAL LARGE L43 IN SMS POLYETHYLENE SIRUS BLUE LEVEL 4 (Gown) ×2 IMPLANT
HANDLE LGHT ASPN LF STRL SNPON (Procedure Accessories) ×2
HANDLE LIGHT SNAP ON ASPEN (Procedure Accessories) ×2 IMPLANT
IRRIGATOR SUCTION ERGONOMIC HAND PIECE STRYKEFLOW II (Suction) ×2 IMPLANT
IRRIGATOR SUCTION STRYKEFLOW 2 (Suction) ×2 IMPLANT
KIT INFECTION CONTROL CUSTOM (Kits) ×2 IMPLANT
KIT INFECTION CONTROL CUSTOM IFOH03 (Kits) ×2 IMPLANT
KIT SRGBSN LF STRL 2 FOAK DISP (Procedure Accessories) ×2
KIT SURGICAL BASIN 2 FOAK (Procedure Accessories) ×2 IMPLANT
KIT SURGICAL BASIN 2 FOAK MEDLINE INDUSTRIES, INC. (Procedure Accessories) ×2 IMPLANT
MANIFOLD SCT 2 STD NPTN 2 LF NS 4 PORT (Filter) ×2
MANIFOLD SUCTION 2 STANDARD 4 PORT (Filter) ×2 IMPLANT
MANIFOLD SUCTION 2 STANDARD 4 PORT NEPTUNE 2 WASTE MANAGEMENT SYSTEM (Filter) ×2 IMPLANT
NEEDLE HPO PP RW BD 22GA 1.5IN LF STRL (Needles) ×2 IMPLANT
NEEDLE HYPODERMIC L1 1/2 IN OD22 GA REGULAR WALL BD POLYPROPYLENE (Needles) ×2 IMPLANT
PACK SRG LF STRL GN LAP SHR DISP (Tray) ×2
PACK SURGICAL GENERAL LAP SHARE (Tray) ×2 IMPLANT
PACK SURGICAL GENERAL LAP SHARE MEDLINE INDUSTRIES, INC (Tray) ×2 IMPLANT
PASSER SUT CONE WECK EFX 9.82IN LF STRL (Procedure Accessories) ×2
PORT ENDOSCOPIC FLEXIBLE CANNULA (Endoscopic Supplies) ×2 IMPLANT
PORT ENDOSCOPIC FLEXIBLE CANNULA OBTURATOR OD12 MM ODSEC5 MM SILS (Endoscopic Supplies) ×2 IMPLANT
PORT ESCP SILS 12MM 5MM LF STRL FLXB CNN (Endoscopic Supplies) ×2
POUCH SPEC RTRVL PU E-CTCH GLD 10MM 34.5 (Laparoscopy Supplies) ×2
POUCH SPECIMEN RETRIEVAL L34.5 CM (Laparoscopy Supplies) ×2 IMPLANT
POUCH SPECIMEN RETRIEVAL L34.5 CM ERGONOMIC HANDLE LONG CYLINDRICAL (Laparoscopy Supplies) ×2 IMPLANT
RELOAD STAPLER 2 MM 2.5 MM 3 MM L60 MM (Staplers) ×4 IMPLANT
RELOAD STAPLER 2 MM 2.5 MM 3 MM L60 MM ENDO GIA TITANIUM MEDIUM (Staplers) ×4 IMPLANT
RELOAD STAPLER 3 MM 3.5 MM 4 MM L60 MM (Staplers) IMPLANT
RELOAD STAPLER 3 MM 3.5 MM 4 MM L60 MM ENDO GIA TITANIUM MEDIUM THICK (Staplers) IMPLANT
RELOAD STAPLER L45 MM TISSUE ARTICULATE (Staplers) ×2 IMPLANT
RELOAD STAPLER L45 MM TISSUE ARTICULATE BLACK SIGNIA STAPLE SYSTEM (Staplers) ×2 IMPLANT
RELOAD STAPLER L60 MM EXTRA THICK TISSUE (Staplers) ×8 IMPLANT
RELOAD STAPLER L60 MM XTR THCK TISSUE ARTICULATE BLCK SIGNIA STPL SSTM (Staplers) ×8 IMPLANT
RELOAD STAPLER MEDIUM L30 MM TRI-STAPLE (Staplers) ×2 IMPLANT
RELOAD STAPLER MEDIUM L30 MM TRI-STAPLE 2.0 VASCULAR TAN (Staplers) ×2 IMPLANT
RELOAD STAPLER MEDIUM THICK L30 MM (Staplers) ×4 IMPLANT
RELOAD STAPLER MEDIUM THICK L30 MM TRI-STAPLE 2.0 PURPLE (Staplers) ×4 IMPLANT
RELOAD STPLR 45MM LF STRL ARTC TISS DISP (Staplers) ×2
RELOAD STPLR 60MM LF STRL ARTC X THKTIS (Staplers) ×8
RELOAD STPLR MED 3-STPL 2 30MM LF STRL (Staplers) ×2
RELOAD STPLR TI 2MM 2.5MM 3MM EGIA 60MM (Staplers) ×4
RELOAD STPLR TI 3MM 3.5MM 4MM EGIA 60MM (Staplers)
RELOAD TRI-STPL 3-4MM PPL 30MM (Staplers) ×4
SET HIGH FLOW SMOKE EVACUATION (Tubing) ×2 IMPLANT
SET HIGH FLOW SMOKE EVACUATION PNEUMOCLEAR TUBING (Tubing) ×2 IMPLANT
SET TUBING HBRD EGA-500 OLMPS (Tubing) ×2 IMPLANT
SET TUBING HYBRID EGA-500 OLYMPUS (Tubing) ×2 IMPLANT
SET TUBING PNEUMOCLEAR HFLO SMK EVAC (Tubing) ×2
SLEEVE LAPAROSCOPIC L100 MM STABILITY CANNULA RADIOPAQUE TROCAR OD12 (Endoscopic Supplies) ×8 IMPLANT
SLEEVE LAPAROSCOPIC L100 MM UNIVERSAL (Endoscopic Supplies) ×8 IMPLANT
SLEEVE LAPSCP UNV EPTH XCL 12MM 100MM LF (Endoscopic Supplies) ×8
SOLUTION ANFG ISOPRPNL FM DVN DFGR FGOUT (Procedure Accessories) ×2
SOLUTION ANTIFOG NONTOXIC NONFLAMMABLE (Procedure Accessories) ×2 IMPLANT
SOLUTION ANTIFOG NONTOXIC NONFLAMMABLE PAD DEVON ISOPROPANOL FOAM (Procedure Accessories) ×2 IMPLANT
SOLUTION IRR 0.9% NACL 3L ARTHMTC LF (Irrigation Solutions) ×2
SOLUTION IRRIGATION 0.9% SODIUM CHLORIDE (Irrigation Solutions) ×2 IMPLANT
SPONGE LAP CTTN 18X18IN LF STRL 4 PLY (Sponge) ×2
SPONGE LAPAROTOMY L18 IN X W18 IN 4 PLY (Sponge) ×2 IMPLANT
SPONGE LAPAROTOMY L18 IN X W18 IN 4 PLY RADIOPAQUE PYRONEMA FREE HIGH (Sponge) ×2 IMPLANT
STAPLER EXTRA THICK TISSUE L26 CM X W4 (Procedure Accessories) ×2 IMPLANT
STAPLER EXTRA THICK TISSUE L26 CM X W4 MM OD12 MM ENDOSCOPIC (Procedure Accessories) ×2 IMPLANT
STAPLER INTNL TI XL UNV EGIA ULT 12MM 26 (Procedure Accessories) ×2
SUTURE ABS 0 VCL 54IN BRD TIE COAT VIOL (Suture) ×4
SUTURE ABS 4-0 PS2 VCL MTPS 27IN BRD (Suture) ×4
SUTURE COATED VICRYL 0 L54 IN BRAID TIES (Suture) ×4 IMPLANT
SUTURE COATED VICRYL 4-0 PS-2 L27 IN (Suture) ×4 IMPLANT
SUTURE ETHIBOND EXCEL GREEN 0 SH L30 IN (Suture) IMPLANT
SUTURE ETHIBOND EXCEL GREEN 0 SH L30 IN BRAID NONABSORBABLE (Suture) IMPLANT
SUTURE ETHILON BLACK 2-0 FS L18 IN (Suture) IMPLANT
SUTURE NABSB 0 SH EBND EXC 30IN BRD GRN (Suture)
SUTURE NABSB 2-0 FS ETH 18IN MFL BLK (Suture)
SUTURE NABSB SLK 2-0 SH PRMHND 30IN BRD (Suture) ×14
SUTURE NYLON ETHILON BLACK SILVER 2-0 L18 IN L26 MM REVERSE CUTTING FS (Suture) IMPLANT
SUTURE PASSER CONE WECK EFX L9.82 IN (Procedure Accessories) ×2 IMPLANT
SUTURE SILK PERMA HAND BLACK 2-0 SH L30 (Suture) ×14 IMPLANT
SUTURE SILK PERMA HAND BLACK 2-0 SH L30 IN BRAID NONABSORBABLE (Suture) ×14 IMPLANT
SYRINGE 20 ML BD LUER-LOK MEDICAL (Syringes, Needles) ×2 IMPLANT
SYRINGE 50 ML GRADUATE NONPYROGENIC DEHP (Syringes, Needles) ×2 IMPLANT
SYRINGE 50 ML GRADUATE NONPYROGENIC DEHP FREE PVC FREE LOK MEDICAL (Syringes, Needles) ×2 IMPLANT
SYRINGE MED 20ML LL LF STRL (Syringes, Needles) ×2 IMPLANT
SYRINGE MED 50ML LL LF STRL GRAD N-PYRG (Syringes, Needles) ×2
TROCAR LAPAROSCOPIC BLADELESS STABILITY SLEEVE L100 MM OD12 MM (Laparoscopy Supplies) ×2 IMPLANT
TROCAR LAPAROSCOPIC L100 MM OD15 MM STABILITY SLEEVE BLADELESS (Laparoscopy Supplies) IMPLANT
TROCAR LAPAROSCOPIC L150 MM OD12 MM STABILITY SLEEVE BLADELESS (Laparoscopy Supplies) IMPLANT
TROCAR LAPAROSCOPIC STABILITY SLEEVE (Laparoscopy Supplies) ×2 IMPLANT
TROCAR LAPSCP EPTH XCL 12MM 100MM LF (Laparoscopy Supplies) ×2 IMPLANT
TROCAR LAPSCP EPTH XCL 12MM 150MM LF (Laparoscopy Supplies)
TROCAR LAPSCP EPTH XCL 15MM 100MM LF (Laparoscopy Supplies)
TUBING SCT IRR (Suction) ×2
WATER STERILE PLASTIC POUR BOTTLE 1000 (Irrigation Solutions) ×2 IMPLANT
WATER STERILE PLASTIC POUR BOTTLE 1000 ML (Irrigation Solutions) ×2 IMPLANT
WATER STRL 1000ML LF PLS PR BTL (Irrigation Solutions) ×2

## 2022-10-20 NOTE — Plan of Care (Signed)
Problem: Pain interferes with ability to perform ADL  Goal: Pain at adequate level as identified by patient  Outcome: Progressing  Flowsheets (Taken 10/20/2022 1848)  Pain at adequate level as identified by patient:   Identify patient comfort function goal   Assess for risk of opioid induced respiratory depression, including snoring/sleep apnea. Alert healthcare team of risk factors identified.   Assess pain on admission, during daily assessment and/or before any "as needed" intervention(s)   Reassess pain within 30-60 minutes of any procedure/intervention, per Pain Assessment, Intervention, Reassessment (AIR) Cycle   Evaluate if patient comfort function goal is met   Evaluate patient's satisfaction with pain management progress   Offer non-pharmacological pain management interventions     Problem: Moderate/High Fall Risk Score >5  Goal: Patient will remain free of falls  Outcome: Progressing  Flowsheets (Taken 10/20/2022 1500 by Partner, Margreta Journey, RN)  High (Greater than 13):   HIGH-Consider use of low bed   HIGH-Bed alarm on at all times while patient in bed   MOD-Include family in multidisciplinary POC discussions   MOD-Perform dangle, stand, walk (DSW) prior to mobilization   MOD-Remain with patient during toileting   MOD-Use of chair-pad alarm when appropriate   LOW-Fall Interventions Appropriate for Low Fall Risk     Problem: Compromised Activity/Mobility  Goal: Activity/Mobility Interventions  Outcome: Progressing  Flowsheets (Taken 10/20/2022 1848)  Activity/Mobility Interventions: Pad bony prominences, TAP Seated positioning system when OOB, Promote PMP, Reposition q 2 hrs / turn clock, Offload heels     Problem: Day of Surgery-Gastric Bypass  Goal: Pain at adequate level as identified by patient  Outcome: Progressing  Flowsheets (Taken 10/20/2022 1848)  Pain at adequate level as identified by patient:   Identify patient comfort function goal   Evaluate if patient comfort function goal is met   Administer  analgesics as prescribed to achieve pain goal  Goal: Adequate oxygenation and ventilation is maintained  Outcome: Progressing  Flowsheets (Taken 10/20/2022 1848)  Adequate oxygenation and ventilation is maintained:   Monitor/assess patient's mental status and LOC   Monitor/assess O2 saturation  Goal: Mobility/activity is maintained at optimum level for patient  Outcome: Progressing  Flowsheets (Taken 10/20/2022 1848)  Mobility/activity is maintained at optimum level for patient:   Out of bed to chair with assistance   Supervised progressive ambulation (bed/chair exercises, ambulate in hall 3-4 x a day, BSC, or BRP)

## 2022-10-20 NOTE — Plan of Care (Signed)
Admission Note-  Patient admitted to Surgical unit at 14:40 from PACU. Hand off report received from [place], Therapist, sports .    Patient oriented to room, bed in lowest position, bed alarm activated, call bell within reach.   Fall prevention protocols reviewed with patient and visitor.  Patient informed of visitor policy.  Belongings reviewed.     Brief Clinical Picture:  Neuro AXO 4 but drowsy; Resp 15- O2 98% , Cardiac stable. Pain managed with PRN dilaudid. Denies n/v. Ambulating with x1 assist. Safety maintained. Call lightwithin reach.      Skin Assessment      4 eyes in 4 hours pressure injury assessment note:      Completed with: Margreta Journey, RN  Unit & Time admitted:              Bony Prominences: Check appropriate box; if wound is present enter wound assessment in LDA     Occiput:                 [x] WNL  []  Wound present  Face:                     [x] WNL  []  Wound present  Ears:                      [x] WNL  []  Wound present  Spine:                    [x] WNL  []  Wound present  Shoulders:             [x] WNL  []  Wound present  Elbows:                  [x] WNL  []  Wound present  Sacrum/coccyx:     [x] WNL  []  Wound present  Ischial Tuberosity:  [x] WNL  []  Wound present  Trochanter/Hip:      [x] WNL  []  Wound present  Knees:                   [x] WNL  []  Wound present  Ankles:                   [x] WNL  []  Wound present  Heels:                    [x] WNL  []  Wound present  Other pressure areas:  []  Wound location       Device related: []  Device name:                            LDA completed if wound present: yes/no  Consult WOCN if necessary    Other skin related issues, ie tears, rash, etc, document in Integumentary flowsheet    Braden score <15. No      Any significant admission event : N/A

## 2022-10-20 NOTE — Op Note (Signed)
FULL OPERATIVE NOTE    Date Time: 10/20/22 1:01 PM  Patient Name: Penny Harris, Penny Harris (MRN: [id number])  Attending Physician: Alvina Chou, MD      Date of Operation:   10/20/2022    Providers Performing:   Surgeon(s) and Role:     * Anneta Rounds, Sheral Apley, MD - Primary    Surgical First Assistant(s):   None    Operative Procedure:   LAPAROSCOPIC, GASTRIC BYPASS, CONVERSION FROM SLEEVE GASTRECTOMY: 85277 (CPT)  EGD: 82423 (CPT)  LAPARSCOPIC, PARTIAL GASTRECTOMY: 53614 (CPT)  LAPAROSCOPIC, SMALL BOWEL RESECTION: 44202 (CPT)    Preoperative Diagnosis:   Pre-Op Diagnosis Codes:     * Morbid obesity [E66.01]     * Gastroesophageal reflux disease with esophagitis without hemorrhage [K21.00]    Postoperative Diagnosis:   Post-Op Diagnosis Codes:     * Morbid obesity [E66.01]     * Gastroesophageal reflux disease with esophagitis without hemorrhage [K21.00]  Small bowel mesentery lymphadenopathy  Gastric outlet obstruction    Anesthesia:   General    Findings:   Short, nodular small bowel mesentery consistent with lymphadenitis  Negative air-insufflation leak check  Normal post-anastomotic EGD  Narrow waist in otherwise large stomach      Indications:   The patient is a pleasant 37 y.o.-year-old who has been obese her entire life despite numerous diets.  Currently, her Body mass index is 51 kg/m., and with her comorbidities, she meets the NIH criteria for weight loss surgery.  She previously underwent a sleeve gastrectomy to treat the obesity.  After the sleeve, she experienced modest weight loss, albeit short lived, and soon developed substantial reflux.  After informed consent was obtained as well as insurance preauthorization, we made plans to proceed to the OR for laparoscopic conversion to a Roux-en-Y gastric bypass (gastrojejunostomy).       Procedure:   The patient was brought to the OR after IV antibiotics and sub-Q heparin were administered. Plexi-pulses were placed and activated, and then general anesthesia was  induced without incident. Her legs were secured by two straps and a foot plate, and her arms were secured on arm boards, all with appropriate and ample padding. A careful and thorough time-out was performed, and agreement was unanimous. Her abdomen was prepped sterilely and draped. I made a small stab 15 cm inferior and two centimeters to the left of the xiphoid, and through that incision I placed a 12 mm optical trocar into her abdomen with no complications. The abdomen was insufflated. I then placed 2 right sided 12 mm ports as well as two left ports, all under direct visualization with no complications.  The placement of these ports required about 45 minutes of lysis of adhesions. Following that, I divided the greater omentum to the left of the midline with the harmonic until I reached the transverse colon. I then found the ligament of Treitz, and from the proximal jejunum, I ran out a 50 cm biliopancreatic limb. I divided the bowel there with a 60 mm tan/white load. I then ran a 150 cm Roux limb from the distal bowel, and I approximated the antimesenteric aspect of that 150 cm mark to the end of the biliopancreatic limb with a full length 2-0 silk. I used that for traction while I made an antimesenteric enterotomy on both limbs there with the harmonic. Through those enterotomies, I fired a 30 mm tan/white load to make a side-to-side small bowel anastomosis. I closed the now single enterotomy in 2 layers with silk and  the intermesenteric space with a running silk. I inspected the anastomosis, and I was very pleased with it. I turned to the superior abdomen.    I made a small stab to the left of the xiphoid, and through that I carefully placed a 5 mm locking grasper into the abdomen under direct visualization. It was guided beneath the left lobe of the liver where it served as a liver retractor. The patient was placed in steep reverse Trendelenburg position.    I now performed the long lysis of adhesions needed to  release the stomach, liver, and diaphragm. When that was completed, I was ready to create the pouch. I elevated the lesser curvature 8 cm from the GE junction. There, I carefully dissected through the gastrohepatic ligament into the lesser sac. Then, after ensuring there was nothing in the stomach, I fired 45 mm of a black load transversely across the stomach to form the distal staple line of the pouch. The next staple line was angled toward the angle of His, but prior to taking that and the subsequent firings, I placed a 32 Fr blunt bougie into the pouch to protect it and the esophagus during the rest of the firings. The pouch was completed with 3 more firings.     I inspected the staple lines of the pouch and stomach remnant mm for mm, and I was pleased with them, although the remnant stomach had a narrow neck in the mid stomach that was worrisome for gastric outlet obstruction because of its small lumen.  I chose to resect it to prevent associated complications.  The stomach's ligamentous and vascular attachments were divided this the harmonic, and the stomach was divided with a 60 mm black Medtronic load.  The specimen was removed from the abdomen and sent to pathology.     I now used cautery to create a small gastrotomy on the distal pouch.  I elevated the roux limb into the superior field, and about 12 cm from its end, I made an antimesenteric enterotomy.  Through that gastrotomy and enterotomy, I attempted to fire a 30 mm purple load to create an anti-colic, anti-gastric 751 cm linear stapled gastroenterostomy.   In so doing, I realized that the mesentery of the bowel there was uncomfortably short, and it did not reach without tension.  I searched for a better-reaching segment, and I found a much more satisfactory one perhaps 15 cm more distal on the Roux.  I resolved to use that segment for anastomosis.  The afferent limb would now be 30, which is unacceptably long.  I therefore resected the distal 15-20 cm  of small bowel.  I took down the mesentery with the harmonic scalpel, and I divided the bowel itself with a 60 mm tan Covidien GIA load.  The bowel was removed from the abdomen.    Through that gastrotomy and enterotomy, I now re-attempted to fire a 30 mm purple load to create an anti-colic, anti-gastric 025 cm linear stapled gastroenterostomy.  It reached nicely this time.  I secured the crotch of that anastomosis with a 2-0 GI silk, and I closed the now-single gastrotomy in two running layers with 2-0 silk.  I inspected the anastomosis carefully, and I was very pleased with it.    I now leveled the patient and placed an atraumatic bowel clamp across the Roux limb just distal to the G-J anastomosis. I submerged the pouch and anastomosis in irrigated saline. I scrubbed out, and with the help of the  anesthetist, I advanced a standard gastroscope without difficulty through the patient's oropharynx and esophagus into the pouch, which was inspected. It was of normal caliber with no evidence of bleeding. The scope passed easily across the widely-patent anastomosis into either limb of small bowel. I inflated air into the bowel lumen, while we observed the laparoscopic field. We observed good inflation of the pouch and small bowel beyond, with no evidence of bubbling, even with provocative maneuvers. We were satisfied that we had an adequate and negative leak check. I now evacuated the bowel and withdrew the scope. I scrubbed back in.    I now ran Pedersen's space closed with a 2-0 GI silk. I inspected the entire abdomen carefully for organ injury and hemostasis. There was no evidence of organ injury, and hemostasis was excellent. I watched all of the port sites as the ports were removed one at a time. There was no bleeding from those sites. I deflated the abdomen.    I now approximated the dermis at all wounds with interrupted 4-0 Vicryl sutures. The skin was sealed with Dermabond. The patient tolerated the procedure  well. She awoke without incident and was transported to the PACU in stable condition. All the counts were correct.     Estimated Blood Loss:   50 mL    Implants:   * No implants in log *       Drains:   Drains: no      Specimens:     ID Type Source Tests Collected by Time Destination   A : Portion of Stomach Resection Stomach SURGICAL PATHOLOGY Khadija Thier, Sheral Apley, MD 10/20/2022 1202    B : Portion of Small Bowel Resection Small Bowel SURGICAL PATHOLOGY Yazlynn Birkeland, Sheral Apley, MD 10/20/2022 4782          Complications:   None    Signed by: Alvina Chou, MD  [facility]

## 2022-10-20 NOTE — Progress Notes (Signed)
Attempted to call sister at the number given, after 6 rings, it went to busy signal.    MAF

## 2022-10-20 NOTE — Anesthesia Postprocedure Evaluation (Addendum)
Anesthesia Post Evaluation    Patient: Penny Harris    Procedure(s):  LAPAROSCOPIC, GASTRIC BYPASS, CONVERSION FROM SLEEVE GASTRECTOMY  EGD  LAPARSCOPIC, PARTIAL GASTRECTOMY  LAPAROSCOPIC, SMALL BOWEL RESECTION    Anesthesia type: general    Last Vitals:   Vitals Value Taken Time   BP 125/68 10/20/22 1420   Temp 36.7 C (98 F) 10/20/22 1428   Pulse 79 10/20/22 1430   Resp 16 10/20/22 1420   SpO2 98 % 10/20/22 1430                 Anesthesia Post Evaluation:     Patient Evaluated: bedside  Patient Participation: complete - patient participated  Level of Consciousness: awake and alert    Pain Management: adequate  Multimodal analgesia pain management approach  Strategies: NMDA antagonists and acetaminophen  Airway Patency: patent        Anesthetic complications: No      PONV Status: none    Cardiovascular status: acceptable  Respiratory status: acceptable  Hydration status: acceptable          Signed by: Eino Farber, MD, 10/20/2022 4:31 PM

## 2022-10-20 NOTE — Interval H&P Note (Signed)
History reviewed, brief examination done. Procedure and risks discussed. No changes in status.    Alvina Chou, MD  10/20/2022  10:04 AM

## 2022-10-20 NOTE — Transfer of Care (Signed)
Anesthesia Transfer of Care Note    Patient: Penny Harris    Procedures performed: Procedure(s):  LAPAROSCOPIC, GASTRIC BYPASS, CONVERSION FROM SLEEVE GASTRECTOMY  EGD  LAPARSCOPIC, PARTIAL GASTRECTOMY  LAPAROSCOPIC, SMALL BOWEL RESECTION    Anesthesia type: General ETT    Patient location:Phase I PACU    Last vitals:   Vitals:    10/20/22 1311   BP: 128/70   Pulse: 78   Resp: (!) 38   Temp:    SpO2: 100%       Post pain: Patient not complaining of pain, continue current therapy      Mental Status:alert     Respiratory Function: tolerating face mask    Cardiovascular: stable    Nausea/Vomiting: patient not complaining of nausea or vomiting    Hydration Status: adequate    Post assessment: no apparent anesthetic complications  P/t VSS, spont breathing, taken to phase 1 pacu on 8 L FM, attached to monitors and o2 in pacu, report given to pacu RN, VSS and spont breathing in pacu.    Signed by: Jocelyn Lamer, CRNA  10/20/22 1:11 PM

## 2022-10-21 ENCOUNTER — Encounter: Payer: Self-pay | Admitting: Surgery

## 2022-10-21 LAB — CBC
Absolute NRBC: 0 10*3/uL (ref 0.00–0.00)
Hematocrit: 29.5 % — ABNORMAL LOW (ref 34.7–43.7)
Hgb: 9.6 g/dL — ABNORMAL LOW (ref 11.4–14.8)
MCH: 27.3 pg (ref 25.1–33.5)
MCHC: 32.5 g/dL (ref 31.5–35.8)
MCV: 83.8 fL (ref 78.0–96.0)
MPV: 12.6 fL — ABNORMAL HIGH (ref 8.9–12.5)
Nucleated RBC: 0 /100 WBC (ref 0.0–0.0)
Platelets: 167 10*3/uL (ref 142–346)
RBC: 3.52 10*6/uL — ABNORMAL LOW (ref 3.90–5.10)
RDW: 16 % — ABNORMAL HIGH (ref 11–15)
WBC: 13.08 10*3/uL — ABNORMAL HIGH (ref 3.10–9.50)

## 2022-10-21 NOTE — UM Notes (Addendum)
** This review is compiled from documentation provided by the treatment team within the patient's medical record. **     Velna Hatchet, RN, BSN, ACM  Clinical Case Manager - Utilization Review    [facility]  [address], [address]  [place], [place], [address]    Fax Number: [place]: [phone]  Confidential VM: [phone]  [email or web address]      Please use fax number or insurance line to provide authorization for hospital services or to request additional information.       [facility]   ([place]) [facility]   ([place]) [facility]   [facility]) [facility]   [facility]) [facility]  [facility])   [place]  [place], [place] [address]  [place], [place] [address] [address]  [place], [place] [address] [phone] [facility].  [place], [place] [address] [address]  [place], [place] [address]   NPI: [id number]  Tax ID: [id number] NPI: [id number]  Tax ID: [id number] NPI: [id number]  Tax ID: [id number] NPI: [id number]  Tax ID: [id number] NPI: [id number]  Tax ID: [id number]       Clinical Review   Date of Service: 1/31-2/1    Patient Name: Penny Harris  DOB: 11-Feb-1986      37 y.o. female presents for elective admission    Status Order:  ADMIT TO INPATIENT (Order #[id number]) on 10/20/22           General Information    Date: 10/20/2022 Time: 1000 Status: Posted   Location:  MAIN OR Room: RM 10 Service: Bariatric   Patient class: Surgery Admit (IP) Case classification: Elective      Procedure Not Performed    No data filed  Panel Information    Panel 1    Provider Role   Fitzer, Sheral Apley, MD Primary    Procedure Laterality Anesthesia   LAPAROSCOPIC, GASTRIC BYPASS, CONVERSION FROM SLEEVE GASTRECTOMY [80165 (CPT)] N/A General   EGD [53748 (CPT)] N/A General   LAPARSCOPIC, PARTIAL GASTRECTOMY [27078 (CPT)] N/A General   LAPAROSCOPIC, SMALL BOWEL RESECTION [67544 (CPT)]  N/A General          Findings:   Short, nodular small bowel mesentery consistent with lymphadenitis  Negative air-insufflation leak check  Normal post-anastomotic EGD  Narrow waist in otherwise large stomach    Complications:   None      To surgery floor post procedure          -------Clinical Update 2/1-----          Per MD progress note:    Subjective:   Pt is awake, alert, and sitting up in bed. Pt complains of generalized abdominal pain. +flatus. Pain controlled on oxycodone and tylenol PRN. Denies N/V. Tolerating 3 oz per hour of PO liquids. Ambulating in the halls.       Assessment:   Ms. Anzaldo is a 37 y.o. F with history of morbid obesity POD #1 s/p laparoscopic gastric bypass. She  is recovering well without evidence of complications including anastomotic leak, DVT/PE, or hemorrhage.      Plan:   - Pain controlled. Ofirmev 1000mg  IV q6hr x 24 hours post op. Continue oxycodone and tylenol PO prn.   -Satting low-mid 90s on RA. Encouraged ambulation and IS use.  -IV Zosyn given this morning.  -BP controlled. Continue metoprolol and hydralazine with holding parameters.   -Continue bariatric clear liquids  for goal of 4oz/hr. Sip slowly. No jello. Use 1oz cups. Protonix for GI ppx. Zofran and Compazine PRN nausea. Simethicone PRN gas pains. Counseled at length on avoiding overfilling.  -Adequate UOP. Continue IVF.  -SQH and SCDs for DVT ppx.  -Continue home losartan.      Awaiting increased tolerance of liquids as unable to maintain adequate hydration on own and requiring IVF.     Discharge home pending pt able to regularly drink 4oz per hour without overfilling.            Vitals:    10/21/22 0503 10/21/22 0725 10/21/22 0733 10/21/22 1118   BP: 126/81 (!) 144/92  137/82   Pulse: 81 75  69   Resp: 15 17  17    Temp: 98.1 F (36.7 C) 98.6 F (37 C)  99.1 F (37.3 C)   TempSrc: Oral Oral  Oral   SpO2: 92% (!) 89% 90% (!) 89%   Weight:       Height:         Scheduled Meds:  Current Facility-Administered  Medications   Medication Dose Route Frequency    heparin (porcine)  5,000 Units Subcutaneous Q8H [place]    hydrALAZINE  10 mg Intravenous Q6H    losartan  50 mg Oral QAM    metoprolol tartrate  25 mg Oral Q12H SCH    pantoprazole  40 mg Oral QAM AC    piperacillin-tazobactam  4.5 g Intravenous Q8H    piperacillin-tazobactam  4.5 g Intravenous Once    sucralfate  1 g Oral TID AC & HS     Continuous Infusions:   dextrose 5 % and 0.45 % NaCl with KCl 20 mEq 130 mL/hr at 10/21/22 0611    lactated ringers Stopped (10/20/22 1309)     PRN Meds:  HYDROmorphone (DILAUDID) injection 0.5 mg  Dose: 0.5 mg  Freq: Every 3 hours PRN Route: IV  PRN Reason: severe pain  Start: 10/20/22 1446   Given x1    hyoscyamine SL (LEVSIN/SL) tablet 0.125 mg  Dose: 0.125 mg  Freq: Every 6 hours PRN Route: SL  PRN Reason: bladder spasms  PRN Comment: esophageal spasm  Start: 10/20/22 1446   Given x1    oxyCODONE (ROXICODONE) 5 MG/5ML solution 10 mg  Dose: 10 mg  Freq: Every 3 hours PRN Route: PO  PRN Reason: severe pain  Start: 10/20/22 1446   Given x1    oxyCODONE (ROXICODONE) 5 MG/5ML solution 5 mg  Dose: 5 mg  Freq: Every 3 hours PRN Route: PO  PRN Reason: moderate pain  Start: 10/20/22 1446   Given x1    prochlorperazine (COMPAZINE) injection 5 mg  Dose: 5 mg  Freq: Every 6 hours PRN Route: IV  PRN Reasons: Nausea,Vomiting  Start: 10/20/22 1446   Given x1    simethicone (MYLICON) chewable tablet 80 mg  Dose: 80 mg  Freq: Every 6 hours PRN Route: PO  PRN Reason: Flatulence  PRN Comment: indigestion  Start: 10/21/22 0000   Given x1      Recent Labs     10/21/22  0529   RBC 3.52*   Hgb 9.6*   Hematocrit 29.5*      Latest Reference Range & Units 10/21/22 05:29   WBC 3.10 - 9.50 x10 3/uL 13.08 (H)   (H): Data is abnormally high

## 2022-10-21 NOTE — Progress Notes (Signed)
PROGRESS NOTE    Date Time: 10/21/22 10:59 AM  Patient Name: Penny Harris      Assessment:   Ms. Savell is a 37 y.o. F with history of morbid obesity POD #1 s/p laparoscopic gastric bypass. She  is recovering well without evidence of complications including anastomotic leak, DVT/PE, or hemorrhage. Awaiting increased tolerance of liquids as unable to maintain adequate hydration on own and requiring IVF.     Plan:   - Pain controlled. Ofirmev 1000mg  IV q6hr x 24 hours post op. Continue oxycodone and tylenol PO prn.   -Satting low-mid 90s on RA. Encouraged ambulation and IS use.  -IV Zosyn given this morning.  -BP controlled. Continue metoprolol and hydralazine with holding parameters.   -Continue bariatric clear liquids for goal of 4oz/hr. Sip slowly. No jello. Use 1oz cups. Protonix for GI ppx. Zofran and Compazine PRN nausea. Simethicone PRN gas pains. Counseled at length on avoiding overfilling.  -Adequate UOP. Continue IVF.  -SQH and SCDs for DVT ppx.  -Continue home losartan.  Discharge home pending pt able to regularly drink 4oz per hour without overfilling.     Subjective:   Pt is awake, alert, and sitting up in bed. Pt complains of generalized abdominal pain. +flatus. Pain controlled on oxycodone and tylenol PRN. Denies N/V. Tolerating 3 oz per hour of PO liquids. Ambulating in the halls.     Medications:     Current Facility-Administered Medications   Medication Dose Route Frequency    heparin (porcine)  5,000 Units Subcutaneous Q8H [place]    hydrALAZINE  10 mg Intravenous Q6H    losartan  50 mg Oral QAM    metoprolol tartrate  25 mg Oral Q12H SCH    pantoprazole  40 mg Oral QAM AC    piperacillin-tazobactam  4.5 g Intravenous Q8H    piperacillin-tazobactam  4.5 g Intravenous Once    sucralfate  1 g Oral TID AC & HS       Review of Systems:   A comprehensive review of systems was: Negative except generalized abdominal pain    Physical Exam:     Vitals:    10/21/22 0733   BP:    Pulse:    Resp:    Temp:     SpO2: 90%       Intake and Output Summary (Last 24 hours) at Date Time    Intake/Output Summary (Last 24 hours) at 10/21/2022 1059  Last data filed at 10/21/2022 0850  Gross per 24 hour   Intake 3550 ml   Output 1825 ml   Net 1725 ml       General appearance - alert, well appearing, and in no distress  Mental status - alert, oriented to person, place, and time  Chest - clear to auscultation, no wheezes, rales or rhonchi, symmetric air entry  Heart - normal rate, regular rhythm, normal S1, S2, no murmurs, rubs, clicks or gallops  Abdomen - soft, non-distended, no guarding, appropriate ttp, no peritoneal signs, incisions c/d/l/d  Neurological - alert, oriented, normal speech, no focal findings or movement disorder noted  Extremities - no edema, redness or tenderness in the calves or thighs  Skin - normal coloration and turgor, no rashes, no suspicious skin lesions noted    Labs:       Recent CBC   Recent Labs     10/21/22  0529   RBC 3.52*   Hgb 9.6*   Hematocrit 29.5*   MCV 83.8   MCH 27.3  MCHC 32.5   RDW 16*   MPV 12.6*       Rads:   Radiological Procedure reviewed.    Signed by: Roderick Pee, PA

## 2022-10-21 NOTE — Progress Notes (Signed)
Anesthesia Post-Op Check    Pt interviewed and denies any anesthestic complications   except for nausea controlled by antiemetics. Voiding without difficulty.

## 2022-10-21 NOTE — Discharge Instr - AVS First Page (Addendum)
Reason for your Hospital Admission:  Laparoscopic gastric bypass      Instructions for after your discharge:  Follow the Green Diet Sheet.  Drink at least 48 ounces of fluid daily.  Take Protonix daily, starting tomorrow. You will take this for 3 months!  No driving for 6 days.  HOLD HCTZ for 30 days after surgery. Take plain losartan instead.   Start multivitamin and Vitamin B12. HOLD calcium.  No strenuous activity or lifting greater than 25 pounds for 4 weeks after surgery.  Call if worsening pain or intolerance to oral liquids.    Follow-up with Dr. Delila Pereyra office in 3 weeks.

## 2022-10-21 NOTE — Plan of Care (Signed)
NURSE NOTE SUMMARY  [place]   Patient Name: [facility]   Attending Physician: Alvina Chou, MD   Today's date:   10/21/2022 LOS: 1 days   Shift Summary:                                                                A+0X4  Vitals: WNL  Pain: Pain is manageable on current regimen. Managed with scheduled Tylenol and PRN Dilaudid. Will give PRN Oxycodone if pain level allows.   Dressing: OTA dermabond  Incision site: x6 lap sites  Ambulating: yes, SBA  Voiding: yes Clear Yellow urine  Passing Gas: no  BM: no   Drain: none  Foley: no    Bariatric:  Patient able to tolerate 1 oz/hr without s/s of overfilling. Patient did have nausea. PRN Zofran given and scope patch behind L ear.             Provider Notifications:   N/A   Rapid Response Notifications:  Mobility:     N/A PMP Activity: Step 7 - Walks out of Room (10/20/2022 11:13 PM)     Weight tracking:  Family Dynamic:   Last 3 Weights for the past 72 hrs (Last 3 readings):   Weight   10/20/22 1526 143.3 kg (316 lb)   10/20/22 0837 143.3 kg (316 lb)             Recent Vitals Last Bowel Movement   BP: 129/87 (10/21/2022 12:28 AM)  Heart Rate: 69 (10/21/2022 12:28 AM)  Temp: 98.4 F (36.9 C) (10/21/2022 12:28 AM)  Resp Rate: 18 (10/21/2022 12:28 AM)  Height: 1.676 m (5\' 6" ) (10/20/2022  3:26 PM)  Weight: 143.3 kg (316 lb) (10/20/2022  3:26 PM)  SpO2: 90 % (10/21/2022 12:28 AM)   No data recorded         Problem: Moderate/High Fall Risk Score >5  Goal: Patient will remain free of falls  Outcome: Progressing  Flowsheets (Taken 10/20/2022 1930)  High (Greater than 13):   LOW-Fall Interventions Appropriate for Low Fall Risk   MOD-Perform dangle, stand, walk (DSW) prior to mobilization   MOD-Place Fall Risk level on whiteboard in room   HIGH-Bed alarm on at all times while patient in bed   MOD-Remain with patient during toileting     Problem: Day of Surgery-Gastric Bypass  Goal: Pain at adequate level as identified by patient  Outcome:  Progressing  Flowsheets (Taken 10/20/2022 1848 by Hinda Lenis, RN)  Pain at adequate level as identified by patient:   Identify patient comfort function goal   Evaluate if patient comfort function goal is met   Administer analgesics as prescribed to achieve pain goal  Goal: Mobility/activity is maintained at optimum level for patient  Outcome: Progressing  Flowsheets (Taken 10/20/2022 1848 by Hinda Lenis, RN)  Mobility/activity is maintained at optimum level for patient:   Out of bed to chair with assistance   Supervised progressive ambulation (bed/chair exercises, ambulate in hall 3-4 x a day, BSC, or BRP)

## 2022-10-21 NOTE — Consults (Signed)
**Note De-Identified Penny Harris Obfuscation** Inpatient Bariatric Nutrition Education       Surgery Type: RNY  Surgery Date: 10/20/22    Nutrition education done by surgeon's RD pre-operatively: Yes    Reinforced nutritional guidelines: Yes    Instructed patient on adequate fluid intake with goal of drinking 64 ounces/day: Yes    Patient verbalized understanding of diet progression: Yes    Patient has home supply of protein supplements: Yes    Patient has home supply of vitamins and minerals: Yes    Going Home Nutrition information reviewed with patient: Yes    Educational Financial controller or emailed to patient: Yes    Patient verbalized comprehension of bariatric nutrition guidelines to follow upon discharge: Yes    Notes: Questions answered. Support person present.     [name] [place] Nabor MS RD

## 2022-10-21 NOTE — Progress Notes (Signed)
Discharge instructions given and reviewed with patient, verbalized understanding. IV discontinued, tolerated well. Belongings reviewed and sent.

## 2022-10-21 NOTE — Plan of Care (Addendum)
NURSE NOTE SUMMARY  [facility]   Patient Name: Penny Harris   Attending Physician: Alvina Chou, MD   Today's date:   10/21/2022 LOS: 1 days   Shift Summary:                                                                A+0X 4  Vitals: WNL  Pain: Pain is manageable on current regimen Managed with oxycodone and tylenol.  Dressing: OTA  Incision site: abd  Ambulating: yes (Steady Gait, SBA)  Voiding: yes Clear Yellow urine  Passing Gas: yes  BM: no     Bariatric:  Patient able to tolerate 4 oz/hr without s/s of overfilling.    Denies any N/V    Provider Notifications:      Rapid Response Notifications:  Mobility:      PMP Activity: Step 7 - Walks out of Room (10/21/2022  1:40 PM)     Weight tracking:  Family Dynamic:   Last 3 Weights for the past 72 hrs (Last 3 readings):   Weight   10/20/22 1526 143.3 kg (316 lb)   10/20/22 0837 143.3 kg (316 lb)             Recent Vitals Last Bowel Movement   BP: 149/85 (10/21/2022  4:36 PM)  Heart Rate: 77 (10/21/2022  4:36 PM)  Temp: 99.1 F (37.3 C) (10/21/2022  3:18 PM)  Resp Rate: 15 (10/21/2022  3:18 PM)  Height: 1.676 m (5\' 6" ) (10/20/2022  3:26 PM)  Weight: 143.3 kg (316 lb) (10/20/2022  3:26 PM)  SpO2: 94 % (10/21/2022  3:18 PM)   No data recorded        Problem: Pain interferes with ability to perform ADL  Goal: Pain at adequate level as identified by patient  Outcome: Progressing  Flowsheets (Taken 10/21/2022 1140)  Pain at adequate level as identified by patient:   Identify patient comfort function goal   Assess for risk of opioid induced respiratory depression, including snoring/sleep apnea. Alert healthcare team of risk factors identified.   Assess pain on admission, during daily assessment and/or before any "as needed" intervention(s)   Reassess pain within 30-60 minutes of any procedure/intervention, per Pain Assessment, Intervention, Reassessment (AIR) Cycle   Evaluate if patient comfort function goal is met   Evaluate patient's satisfaction  with pain management progress   Offer non-pharmacological pain management interventions     Problem: Side Effects from Pain Analgesia  Goal: Patient will experience minimal side effects of analgesic therapy  Outcome: Progressing  Flowsheets (Taken 10/21/2022 1140)  Patient will experience minimal side effects of analgesic therapy:   Monitor/assess patient's respiratory status (RR depth, effort, breath sounds)   Assess for changes in cognitive function     Problem: Day of Surgery-Gastric Bypass  Goal: Patient has stable vital signs and fluid balance  Outcome: Progressing  Flowsheets (Taken 10/21/2022 1140)  Patient has stable vital signs and fluid balance:   Monitor vital signs   Monitor/assess O2 saturation   Monitor intake and output. Notify LIP if urine output is less than 240 mL in 8 hours   Monitor lab values. Notify LIP of abnormal results.  Goal: Pain at adequate level as identified by patient  Outcome: Progressing  Flowsheets (  Taken 10/21/2022 1140)  Pain at adequate level as identified by patient:   Identify patient comfort function goal   Evaluate if patient comfort function goal is met   Administer analgesics as prescribed to achieve pain goal  Goal: Adequate oxygenation and ventilation is maintained  Outcome: Progressing  Flowsheets (Taken 10/21/2022 1140)  Adequate oxygenation and ventilation is maintained:   Monitor/assess patient's mental status and LOC   Monitor/assess patient's respiratory status (RR, depth, effort, breath sounds)   Monitor/assess O2 saturation   Teach/reinforce use of incentive spirometer 10 times per hour while awake, cough and deep breath as needed  Goal: Mobility/activity is maintained at optimum level for patient  Outcome: Progressing  Flowsheets (Taken 10/21/2022 1140)  Mobility/activity is maintained at optimum level for patient:   Out of bed to chair with assistance   Supervised progressive ambulation (bed/chair exercises, ambulate in hall 3-4 x a day, BSC, or BRP)   VTE Prevention:  administer anticoagulant(s) and/or apply anti-embolism stockings/devices as ordered  Goal: Nutritional intake is adequate  Outcome: Progressing  Flowsheets (Taken 10/21/2022 1140)  Nutritional intake is adequate:   Monitor intake and output   Encourage frequent sips (27mL) of water every 10 minutes if ordered   Assess GI status (bowel sounds, nausea/vomiting, distention, flatus)   Administer acid-reducer meds as prescribed   Bariatric dietician consult  Goal: Patient/patient care companion demonstrates understanding of disease process, treatment plan, medications, and discharge plans  Outcome: Progressing  Flowsheets (Taken 10/21/2022 1140)  Patient/patient care companion demonstrates understanding of surgery/treatment plan, medication, and discharge plans:   Involve the patient's family/support person regarding discharge planning process   Provide/review education on patient's medications, medication side effects, diet restrictions, and post-operative management to patient/patient care companion

## 2022-10-25 LAB — LAB USE ONLY - HISTORICAL SURGICAL PATHOLOGY

## 2022-10-27 NOTE — Discharge Summary (Signed)
Physician Discharge Summary     Patient ID:  Penny Harris  [id number]  36 y.o.  1986-09-12    Admit date: 10/20/2022    Discharge date and time: 10/21/2022  5:46 PM     Admitting Physician: Rodman Key A. Baylen Buckner, MD, Allegra [place], [place]    Discharge Physician: Sheral Apley. 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  She was treated with IV fluid, PO clear liquids, and PO analgesia.  She did fairly well overnight.  I interviewed her the afternoon of POD 1, and she felt well and looked good, and her PO intake was adequate for discharge to be considered.  Her vitals were essentially normal.  She demonstrated a good understanding of her discharge instructions during an lengthy bedside counseling session.  Given her good progress and the absence of contraindications, the patient was discharged to her home.    Consults:  None    Significant Diagnostic Studies:  CBC    Treatments:   LAPAROSCOPIC, GASTRIC BYPASS, CONVERSION FROM SLEEVE GASTRECTOMY: 43644 (CPT)  EGD: 43235 (CPT)  LAPARSCOPIC, PARTIAL GASTRECTOMY: 43659 (CPT)  LAPAROSCOPIC, SMALL BOWEL RESECTION: 44202 (CPT)       Discharge Exam:  Blood pressure 149/85, pulse 77, temperature 99.1 F (37.3 C), temperature source Oral, resp. rate 15, height 1.676 m (5\' 6" ),  weight 143.3 kg (316 lb), last menstrual period 09/23/2022, SpO2 94 %.  General appearance: alert, appears stated age and cooperative   Lungs: clear to auscultation bilaterally   Heart: regular rate and rhythm, no murmur, click, rub or gallop   Abdomen: soft, appropriately tender; bowel sounds normal; no masses, no organomegaly    Disposition: Stable, discharged to home.    Patient Instructions:        Medication List        CONTINUE taking these medications      Lipitor 10 MG tablet  Generic drug: atorvastatin     losartan 50 MG tablet  Commonly known as: COZAAR     MULTIPLE VITAMIN PO               Call with problems:  Fever, abdominal or chest pain, vomiting, shortness of breath, wound problems, abnormal blood sugar levels or blood pressures, leg or calf swelling, questions about medications, or any other concerns.    Activity:  No heavy lifting for four weeks.  No driving for at least five days.    Diet: Follow Dr. Delila Pereyra bariatric green sheet.    Wound Care: As directed; do not pick the glue off for at least one full week.    Follow-up with Dr. Lorie Apley in his office in 3 weeks.    Note: 99239 level discharge. This D/C took more than 30 minutes, more than half of which was specifically devoted to counseling.    Signed:  Alvina Chou, MD  10/27/2022  1:34 PM

## 2022-12-14 NOTE — Addendum Note (Signed)
Addendum  created 12/14/22 1059 by Eino Farber, MD    Clinical Note Signed
